# Patient Record
Sex: Female | Born: 1995 | Race: White | Hispanic: No | Marital: Married | Smoking: Never smoker
Health system: Southern US, Community
[De-identification: ages and names within clinical notes are randomized; demographics above are authoritative.]

## PROBLEM LIST (undated history)

## (undated) DIAGNOSIS — J45909 Unspecified asthma, uncomplicated: Secondary | ICD-10-CM

## (undated) DIAGNOSIS — F909 Attention-deficit hyperactivity disorder, unspecified type: Secondary | ICD-10-CM

## (undated) DIAGNOSIS — F419 Anxiety disorder, unspecified: Secondary | ICD-10-CM

## (undated) HISTORY — PX: TYMPANOSTOMY: SHX2586

## (undated) HISTORY — DX: Anxiety disorder, unspecified: F41.9

## (undated) HISTORY — DX: Unspecified asthma, uncomplicated: J45.909

## (undated) HISTORY — DX: Attention-deficit hyperactivity disorder, unspecified type: F90.9

---
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
## 2013-09-05 ENCOUNTER — Encounter: Payer: Self-pay | Admitting: Medical

## 2013-09-05 ENCOUNTER — Ambulatory Visit (INDEPENDENT_AMBULATORY_CARE_PROVIDER_SITE_OTHER): Payer: Managed Care, Other (non HMO) | Admitting: Medical

## 2013-09-05 VITALS — BP 104/60 | HR 89 | Temp 98.2°F | Resp 18 | Ht 70.0 in | Wt 158.0 lb

## 2013-09-05 DIAGNOSIS — F411 Generalized anxiety disorder: Secondary | ICD-10-CM

## 2013-09-05 DIAGNOSIS — J45909 Unspecified asthma, uncomplicated: Secondary | ICD-10-CM

## 2013-09-05 DIAGNOSIS — G43909 Migraine, unspecified, not intractable, without status migrainosus: Secondary | ICD-10-CM

## 2013-09-05 DIAGNOSIS — F909 Attention-deficit hyperactivity disorder, unspecified type: Secondary | ICD-10-CM

## 2013-09-05 MED ORDER — METHYLPHENIDATE HCL 10 MG PO TABS: 10.0000 mg | ORAL_TABLET | Freq: Two times a day (BID) | ORAL | Status: DC

## 2013-09-05 NOTE — Progress Notes (Signed)
Subjective:    Sarah Davidson is a 18 y.o. female here as a new patient.  Here to discuss possible diagnosis of ADHD.  She went to a research study for ADHD.  Had several tests and screening.  At the completion of the phase of study she was in, she wanted to explore treatment options.  Was given option of next phase of study which is non medication counseling, but the time commitment doesn't agree with her schedule.   She is a sophomore at [employer], wants to pursue kinesiology and sports and medicine.  Her academic advisor recommended she join the study at the school.  This was suggested after her freshmen year didn't go so well.  She reports problems with concentration and focus in class, sometimes waits to the last minute to get things done.  Can't sit for very long.  Slight movements in the class causes her to get distracted.  Has tendency to interrupt other.  She is on the soccer team, and given her high school performance, freshmen year grades and being in athletics, has to meet with advisor weekly.    Elementary school - doesn't recall that history as much other than getting in trouble for excessive talking and problems paying attention.  Middle school and high school were similar.  She reports not doing well in high school.  Had issues getting homework turned in.  Mom would have to sit with her and force her to turn things in.  Grades in high school were mostly Cs, occasional Bs.  More difficult classes were  Cs and sometimes Ds.  Now that she has recognized a problem, she has done better and getting work completed and bringing up grades.  Never had evaluation in primary school.    Has one sibling that is not doing well in school.  He yells at her parents a lot, gets into trouble.  Lives in house across from campus's with 3 soccer team mates.  Doesn't drink hardly at all.  Denies drug use, nonsmoker.  Eats healthy.   Doesn't drink soda or a lot of caffeine due to migraines.  No prior legal trouble or  arrests.  Has a boyfriend.    Denies depressed, being down in mood.  Denies mood swing.  +some irritable.  Does have excessive worry, mostly related to school work.     ROS as in subjective  Objective:    BP 104/60  Pulse 89  Temp(Src) 98.2 F (36.8 C)  Resp 18  Ht 5' 10"  (1.778 m)  Wt 158 lb (71.668 kg)  BMI 22.67 kg/m2  SpO2 98%  Objective: General: Well-developed, well-nourished, no acute distress, lean white female Neck: Supple, nontender, no mass, no lymphadenopathy, no thyromegaly Lungs clear Heart RRR, normal S1, normal S2, no murmurs Pulses normal Extremities no edema Psych: Pleasant, answers questions appropriate, seems to get distracted at times    Assessment:   Encounter Diagnoses  Name Primary?  . ADHD (attention deficit hyperactivity disorder) Yes  . Anxiety state, unspecified   . Migraine   . Unspecified asthma(493.90)      Plan:  ADHD, anxiety - discussed her concerns reviewed her recent screening test at [facility] through the ADHD clinic/research trial, showing criteria for ADHD combined type, as well as criteria for generalized anxiety disorder. Her adult ADHD-RS-IV with adult prompts score today - inattentive 24 score, hyperactive/impulse score of 27.   The following criteria for ADHD have been met: inattention, hyperactivity, impulsivity, academic underachievement, behavior problems.  She  is already getting help with counselor at school regarding study skills . I advise she establish with a counselor either at the [facility] ADD clinic or independent counselor to help with managing and dealing with ADHD.  Gave considerable counseling today including study recommendations, sleep, time management, close followup with her advisor. Discussed risk and benefits of medications, and we will use a trial of methylphenidate. Advise she take a half tablet at 9 in the morning, and a half tablet at 4-5pm in the afternoon.  If this works well, we will continue this dose otherwise  she can adjust dose as we discussed today as well as the time a day she takes medication.  Answered her questions. Forms completed for school.  Migraine - having fairly frequent headaches.  She'll return to discuss this when we have more time to devote to this  Asthma - mainly exercise-induced, has up-to-date inhaler, will recheck and discuss further next visit  Duration of today's visit was 50 minutes, with greater than 50% being counsel  F/u 56mosooner, prn

## 2013-09-05 NOTE — Patient Instructions (Addendum)
RESOURCES in [place], [place]  Miguel Aschoff Ph.D., clinical psychologist [phone] office [address] [place], [place] [address] Cognitive Behavior Therapy, Depression, Bipolar, Anxiety, Grief and Loss   AD/HD CLINIC at [facility] at [facility] [address] [address] [place], [place] [address] VOICE :[phone]   [email or web address] [phone] office [address], [place], [place] [address]  Veneda Melter, Ph.D., [facility] Family, Couples, Anxiety, Depression, ADHD, Abuse, Anger Management Sherie Don, M.Ed., LPC Couples, Sexual orientation, Domestic violence, Child Abuse, Major Life Change,  Depression Leandra Kern, [place] Marriage counseling, Women's Issues, Depression, Intimacy, Career Issues Madelaine Etienne, Ph.D.,  LPC PTSD, Addictions, Grief, Anxiety, Sexual Orientation Reather Laurence, [facility] Teen and child depression, anxiety, parenting challenges, Adult depression, self injury, relationship issues. Maple Hudson, LPC Addiction, PTSD, Eating Disorders, Depression, Sexual Orientation Daun Peacock, LPC Eating disorder, Anxiety and Depression, Grief, Divorce, Couples and Family Counseling, Parenting   Attention Deficit Hyperactivity Disorder Attention deficit hyperactivity disorder (ADHD) is a problem with behavior issues based on the way the brain functions (neurobehavioral disorder). It is a common reason for behavior and academic problems in school. SYMPTOMS  There are 3 types of ADHD. The 3 types and some of the symptoms include:  Inattentive  Gets bored or distracted easily.  Loses or forgets things. Forgets to hand in homework.  Has trouble organizing or completing tasks.  Difficulty staying on task.  An inability to organize daily tasks and school work.  Leaving projects, chores, or homework unfinished.  Trouble paying attention or responding to  details. Careless mistakes.  Difficulty following directions. Often seems like is not listening.  Dislikes activities that require sustained attention (like chores or homework).  Hyperactive-impulsive  Feels like it is impossible to sit still or stay in a seat. Fidgeting with hands and feet.  Trouble waiting turn.  Talking too much or out of turn. Interruptive.  Speaks or acts impulsively.  Aggressive, disruptive behavior.  Constantly busy or on the go, noisy.  Often leaves seat when they are expected to remain seated.  Often runs or climbs where it is not appropriate, or feels very restless.  Combined  Has symptoms of both of the above. Often children with ADHD feel discouraged about themselves and with school. They often perform well below their abilities in school. As children get older, the excess motor activities can calm down, but the problems with paying attention and staying organized persist. Most children do not outgrow ADHD but with good treatment can learn to cope with the symptoms. DIAGNOSIS  When ADHD is suspected, the diagnosis should be made by professionals trained in ADHD. This professional will collect information about the individual suspected of having ADHD. Information must be collected from various settings where the person lives, works, or attends school.  Diagnosis will include:  Confirming symptoms began in childhood.  Ruling out other reasons for the child's behavior.  The health care providers will check with the child's school and check their medical records.  They will talk to teachers and parents.  Behavior rating scales for the child will be filled out by those dealing with the child on a daily basis. A diagnosis is made only after all information has been considered. TREATMENT  Treatment usually includes behavioral treatment, tutoring or extra support in school, and stimulant medicines. Because of the way a person's brain works with ADHD,  these medicines decrease impulsivity and hyperactivity and increase attention. This is different than  how they would work in a person who does not have ADHD. Other medicines used include antidepressants and certain blood pressure medicines. Most experts agree that treatment for ADHD should address all aspects of the person's functioning. Along with medicines, treatment should include structured classroom management at school. Parents should reward good behavior, provide constant discipline, and limit-setting. Tutoring should be available for the child as needed. ADHD is a life-long condition. If untreated, the disorder can have long-term serious effects into adolescence and adulthood. HOME CARE INSTRUCTIONS   Often with ADHD there is a lot of frustration among family members dealing with the condition. Blame and anger are also feelings that are common. In many cases, because the problem affects the family as a whole, the entire family may need help. A therapist can help the family find better ways to handle the disruptive behaviors of the person with ADHD and promote change. If the person with ADHD is young, most of the therapist's work is with the parents. Parents will learn techniques for coping with and improving their child's behavior. Sometimes only the child with the ADHD needs counseling. Your health care providers can help you make these decisions.  Children with ADHD may need help learning how to organize. Some helpful tips include:  Keep routines the same every day from wake-up time to bedtime. Schedule all activities, including homework and playtime. Keep the schedule in a place where the person with ADHD will often see it. Mark schedule changes as far in advance as possible.  Schedule outdoor and indoor recreation.  Have a place for everything and keep everything in its place. This includes clothing, backpacks, and school supplies.  Encourage writing down assignments and bringing home  needed books. Work with your child's teachers for assistance in organizing school work.  Offer your child a well-balanced diet. Breakfast that includes a balance of whole grains, protein and, fruits or vegetables is especially important for school performance. Children should avoid drinks with caffeine including:  Soft drinks.  Coffee.  Tea.  However, some older children (adolescents) may find these drinks helpful in improving their attention. Because it can also be common for adolescents with ADHD to become addicted to caffeine, talk with your health care provider about what is a safe amount of caffeine intake for your child.  Children with ADHD need consistent rules that they can understand and follow. If rules are followed, give small rewards. Children with ADHD often receive, and expect, criticism. Look for good behavior and praise it. Set realistic goals. Give clear instructions. Look for activities that can foster success and self-esteem. Make time for pleasant activities with your child. Give lots of affection.  Parents are their children's greatest advocates. Learn as much as possible about ADHD. This helps you become a stronger and better advocate for your child. It also helps you educate your child's teachers and instructors if they feel inadequate in these areas. Parent support groups are often helpful. A national group with local chapters is called Children and Adults with Attention Deficit Hyperactivity Disorder (CHADD). SEEK MEDICAL CARE IF:  Your child has repeated muscle twitches, cough or speech outbursts.  Your child has sleep problems.  Your child has a marked loss of appetite.  Your child develops depression.  Your child has new or worsening behavioral problems.  Your child develops dizziness.  Your child has a racing heart.  Your child has stomach pains.  Your child develops headaches. SEEK IMMEDIATE MEDICAL CARE IF:  Your child has been  diagnosed with  depression or anxiety and the symptoms seem to be getting worse.  Your child has been depressed and suddenly appears to have increased energy or motivation.  You are worried that your child is having a bad reaction to a medication he or she is taking for ADHD. Document Released: 07/23/2002 Document Revised: 05/23/2013 Document Reviewed: 04/09/2013 [facility] Patient Information 2014 [place], [place].   Generalized Anxiety Disorder Generalized anxiety disorder (GAD) is a mental disorder. It interferes with life functions, including relationships, work, and school. GAD is different from normal anxiety, which everyone experiences at some point in their lives in response to specific life events and activities. Normal anxiety actually helps [place] prepare for and get through these life events and activities. Normal anxiety goes away after the event or activity is over.  GAD causes anxiety that is not necessarily related to specific events or activities. It also causes excess anxiety in proportion to specific events or activities. The anxiety associated with GAD is also difficult to control. GAD can vary from mild to severe. People with severe GAD can have intense waves of anxiety with physical symptoms (panic attacks).  SYMPTOMS The anxiety and worry associated with GAD are difficult to control. This anxiety and worry are related to many life events and activities and also occur more days than not for 6 months or longer. People with GAD also have three or more of the following symptoms (one or more in children):  Restlessness.   Fatigue.  Difficulty concentrating.   Irritability.  Muscle tension.  Difficulty sleeping or unsatisfying sleep. DIAGNOSIS GAD is diagnosed through an assessment by your caregiver. Your caregiver will ask you questions aboutyour mood,physical symptoms, and events in your life. Your caregiver may ask you about your medical history and use of alcohol or drugs, including  prescription medications. Your caregiver may also do a physical exam and blood tests. Certain medical conditions and the use of certain substances can cause symptoms similar to those associated with GAD. Your caregiver may refer you to a mental health specialist for further evaluation. TREATMENT The following therapies are usually used to treat GAD:   Medication Antidepressant medication usually is prescribed for long-term daily control. Antianxiety medications may be added in severe cases, especially when panic attacks occur.   Talk therapy (psychotherapy) Certain types of talk therapy can be helpful in treating GAD by providing support, education, and guidance. A form of talk therapy called cognitive behavioral therapy can teach you healthy ways to think about and react to daily life events and activities.  Stress managementtechniques These include yoga, meditation, and exercise and can be very helpful when they are practiced regularly. A mental health specialist can help determine which treatment is best for you. Some people see improvement with one therapy. However, other people require a combination of therapies. Document Released: 11/27/2012 Document Reviewed: 11/27/2012 [facility] Patient Information 2014 [place], [place].

## 2013-09-06 ENCOUNTER — Encounter: Payer: Self-pay | Admitting: Medical

## 2013-09-06 ENCOUNTER — Telehealth: Payer: Self-pay | Admitting: Family Medicine

## 2013-09-06 NOTE — Telephone Encounter (Signed)
[facility] and states you did a letter for the pt today and [place] [facility]  But the [employer] request that the letter be very specific re: ADHD meds.  So Sarah Davidson is asking that you send the same letter to her but at the bottom of the letter add a specific sentence that spells out the medication for the ADHD and the dosing and the follow up plan and [facility] [phone], sorry thought I got the fax.#

## 2013-09-07 ENCOUNTER — Encounter: Payer: Self-pay | Admitting: Medical

## 2013-09-10 ENCOUNTER — Telehealth: Payer: Self-pay | Admitting: Medical

## 2013-09-10 NOTE — Telephone Encounter (Signed)
I spoke to patient, and she notes no change in how she feels on the medication.   I advised she give it more time .  After another week, can try 1.5 tablets (15mg ) at 9am.  I will await her call.

## 2013-10-10 ENCOUNTER — Ambulatory Visit (INDEPENDENT_AMBULATORY_CARE_PROVIDER_SITE_OTHER): Payer: Managed Care, Other (non HMO) | Admitting: Medical

## 2013-10-10 ENCOUNTER — Encounter: Payer: Self-pay | Admitting: Medical

## 2013-10-10 VITALS — BP 100/70 | HR 62 | Temp 98.2°F | Resp 16 | Wt 158.0 lb

## 2013-10-10 DIAGNOSIS — F909 Attention-deficit hyperactivity disorder, unspecified type: Secondary | ICD-10-CM

## 2013-10-10 DIAGNOSIS — F411 Generalized anxiety disorder: Secondary | ICD-10-CM

## 2013-10-10 MED ORDER — AMPHETAMINE-DEXTROAMPHETAMINE 10 MG PO TABS: 10.0000 mg | ORAL_TABLET | Freq: Two times a day (BID) | ORAL | Status: DC

## 2013-10-10 NOTE — Progress Notes (Signed)
Subjective:    Sarah Davidson is a 18 y.o. female here for 12morecheck.  Since [facility]  Due to her busy athletic and school schedule, hasn't been able to go back, but plans to see counseling as we discussed last visit.  She started the Methylphenidate at 191mBID, but after 2 weeks of no obvious effect of the medication, she called in here.  We had her do 1.5 tablets qam, then 2 tablets qam after a week.  At this point she can't tell she is even taking any medication.  Feels no change in focus, attention, or other.   No new c/o.   We made diagnosis of ADHD and anxiety last visit.  She went to a research study for ADHD in the last few months.  Had several tests and screening.  At the completion of the phase of study she was in, she wanted to explore treatment options.  Was given option of next phase of study which is non medication counseling, but the time commitment didn't agree with her schedule.   She is a [employer] pursue kinesiology and sports and medicine.  Her academic advisor recommended she join the study at the school.  This was suggested after her freshmen year didn't go so well.  She reports problems with concentration and focus in class, sometimes waits to the last minute to get things done.  Can't sit for very long.  Slight movements in the class causes her to get distracted.  Has tendency to interrupt other.  She is on the soccer team, and given her high school performance, freshmen year grades and being in athletics, has to meet with advisor weekly.    Elementary school - doesn't recall that history as much other than getting in trouble for excessive talking and problems paying attention.  Middle school and high school were similar.  She reports not doing well in high school.  Had issues getting homework turned in.  Mom would have to sit with her and force her to turn things in.  Grades in high school were mostly Cs, occasional Bs.  More  difficult classes were  Cs and sometimes Ds.  Now that she has recognized a problem, she has done better and getting work completed and bringing up grades.  Never had evaluation in primary school.    Has one sibling that is not doing well in school.  He yells at her parents a lot, gets into trouble.  Lives in house across from campus's with 3 soccer team mates.  Doesn't drink hardly at all.  Denies drug use, nonsmoker.  Eats healthy.   Doesn't drink soda or a lot of caffeine due to migraines.  No prior legal trouble or arrests.  Has a boyfriend.    Denies depressed, being down in mood.  Denies mood swing.  +some irritable.  Does have excessive worry, mostly related to school work.     ROS as in subjective  Objective:    BP 100/70  Pulse 62  Temp(Src) 98.2 F (36.8 C) (Oral)  Resp 16  Wt 158 lb (71.668 kg)  Objective: General: Well-developed, well-nourished, no acute distress, lean white female Psych: Pleasant, answers questions appropriately    Assessment:   Encounter Diagnoses  Name Primary?  . Attention deficit disorder with hyperactivity(314.01) Yes  . Anxiety state, unspecified      Plan:  ADHD, anxiety - discussed her concerns.   Last visit we reviewed  her recent screening test at [facility] through the ADHD clinic/research trial, showing criteria for ADHD combined type, as well as criteria for generalized anxiety disorder. Her adult ADHD-RS-IV with adult prompts score today - inattentive 24 score, hyperactive/impulse score of 27.   The following criteria for ADHD have been met: inattention, hyperactivity, impulsivity, academic underachievement, behavior problems.  She is already getting help with counselor at school regarding study skills.  I advise she c/t seeing a counselor either at the [facility] ADD clinic or independent counselor to help with managing and dealing with ADHD.  Gave considerable counseling today including study recommendations, sleep, time management, close followup  with her advisor.   Discussed risk and benefits of medications.  Given no response at all, we will use trial of Adderall 6m BID.     Duration of visit today 25 minutes, with greater than 50% being counseling  F/u 119mosooner prn

## 2013-11-07 ENCOUNTER — Ambulatory Visit (INDEPENDENT_AMBULATORY_CARE_PROVIDER_SITE_OTHER): Payer: Managed Care, Other (non HMO) | Admitting: Medical

## 2013-11-07 ENCOUNTER — Encounter: Payer: Self-pay | Admitting: Medical

## 2013-11-07 VITALS — BP 100/60 | HR 68 | Temp 98.3°F | Resp 16 | Wt 154.0 lb

## 2013-11-07 DIAGNOSIS — F909 Attention-deficit hyperactivity disorder, unspecified type: Secondary | ICD-10-CM

## 2013-11-07 DIAGNOSIS — F411 Generalized anxiety disorder: Secondary | ICD-10-CM

## 2013-11-07 DIAGNOSIS — J45909 Unspecified asthma, uncomplicated: Secondary | ICD-10-CM

## 2013-11-07 MED ORDER — ALBUTEROL SULFATE HFA 108 (90 BASE) MCG/ACT IN AERS: 2.0000 | INHALATION_SPRAY | Freq: Four times a day (QID) | RESPIRATORY_TRACT | Status: DC | PRN

## 2013-11-07 MED ORDER — METHYLPREDNISOLONE (PAK) 4 MG PO TABS: ORAL_TABLET | ORAL | Status: DC

## 2013-11-07 MED ORDER — ALBUTEROL SULFATE (2.5 MG/3ML) 0.083% IN NEBU: 2.5000 mg | INHALATION_SOLUTION | Freq: Once | RESPIRATORY_TRACT | Status: DC

## 2013-11-07 MED ORDER — AMPHETAMINE-DEXTROAMPHETAMINE 15 MG PO TABS: 15.0000 mg | ORAL_TABLET | Freq: Two times a day (BID) | ORAL | Status: DC

## 2013-11-07 MED ORDER — MOMETASONE FUROATE 220 MCG/INH IN AEPB: 1.0000 | INHALATION_SPRAY | Freq: Two times a day (BID) | RESPIRATORY_TRACT | Status: DC

## 2013-11-07 NOTE — Patient Instructions (Signed)
  Thank you for giving me the opportunity to serve you today.    Your diagnosis today includes: Encounter Diagnoses  Name Primary?  . ADHD (attention deficit hyperactivity disorder) Yes  . Anxiety state, unspecified   . Asthmatic bronchitis      Specific recommendations today include:  Check with your pharmacy for a replacement inhaler as it sounds like your's is defective  Use inhaler 2 puffs every 4- 6 hours or before exercise for the next week  If needed we can send a round of prednisone steroid to pharmacy for asthma flare, but the first step is to use the inhaler first to see if this helps.  You may use Mucinex DM if desired for cough/congestion  Continue your Claritin  Increase to Adderall 15mg  twice daily  Call back in 3-4 weeks to update me on the medication  Call back today if needed for inhaler/steroid   Follow up: with call back

## 2013-11-07 NOTE — Progress Notes (Signed)
Subjective:  Sarah Davidson is a 18 y.o. female who presents for recheck on ADHD.  At last visit we changed to Adderall from methylphenidate.  Currently taking Adderall 10 mg twice daily.  Although she notices improvement on this medication is still quite easy for her to get distracted and lose focus. It worked some but not very strong, feels like it could be stronger.  Certainly works better than methylphenidate.  She does notice a mild decrease in appetite but no sleep issues no other new changes or other side effects.  No headaches like she was getting with methylphenidate.  She does note going to student health a few days ago for cough, was given Z-Pak, using her inhaler but thinks is malfunctioning.  She actually feels worse today with cough, chest tightness, congestion.  No sick contacts.   She does not smoke.  No other aggravating or relieving factors.  No other c/o.  The following portions of the patient's history were reviewed and updated as appropriate: allergies, current medications, past family history, past medical history, past social history, past surgical history and problem list.  ROS as in subjective    Objective: BP 100/60  Pulse 68  Temp(Src) 98.3 F (36.8 C) (Oral)  Resp 16  Wt 154 lb (69.854 kg)   General appearance: Alert, WD/WN, no distress                             Skin: warm, no rash, no diaphoresis                           Head: no sinus tenderness                            Eyes: conjunctiva normal, corneas clear, PERRLA                            Ears: pearly TMs, external ear canals normal                          Nose: septum midline, turbinates swollen, with erythema and clear discharge             Mouth/throat: MMM, tongue normal, mild pharyngeal erythema                           Neck: supple, no adenopathy, no thyromegaly, nontender                          Heart: RRR, normal S1, S2, no murmurs                         Lungs: +bronchial breath  sounds, decreased breath sounds, +scattered rhonchi, +scattered wheezes, no rales                Extremities: no edema, nontender     Assessment: Encounter Diagnoses  Name Primary?  . ADHD (attention deficit hyperactivity disorder) Yes  . Anxiety state, unspecified   . Asthmatic bronchitis     Plan:  ADHD, anxiety - increase to Adderall 20mg  BID.  Discussed medication, risk/benefits.  Call back in 2 wk to update me on symptoms  Asthmatic bronchitis - mild improvement with  2 rounds of albuterol nebulized in office.  Gave demo of asmanex, begin Asmanex short term, begin Medrol dose pak, rest, hydrate well, and advised she go to pharmacy today to get replacement inhaler as her's is apparently malfunctioning. Call/return in 2-3 days if symptoms are worse or not improving.

## 2013-11-07 NOTE — Addendum Note (Signed)
Addended by: Janeice RobinsonSCALES, Dhiren Azimi L on: 11/07/2013 04:31 PM   Modules accepted: Orders

## 2013-12-10 ENCOUNTER — Ambulatory Visit: Payer: Managed Care, Other (non HMO) | Admitting: Medical

## 2013-12-11 ENCOUNTER — Ambulatory Visit: Payer: Managed Care, Other (non HMO) | Admitting: Medical

## 2013-12-12 ENCOUNTER — Ambulatory Visit: Payer: Managed Care, Other (non HMO) | Admitting: Medical

## 2013-12-17 ENCOUNTER — Ambulatory Visit: Payer: Self-pay | Admitting: Medical

## 2013-12-19 ENCOUNTER — Ambulatory Visit: Payer: Managed Care, Other (non HMO) | Admitting: Medical

## 2013-12-19 ENCOUNTER — Encounter: Payer: Self-pay | Admitting: Medical

## 2013-12-24 ENCOUNTER — Encounter: Payer: Self-pay | Admitting: Medical

## 2013-12-24 ENCOUNTER — Ambulatory Visit (INDEPENDENT_AMBULATORY_CARE_PROVIDER_SITE_OTHER): Payer: Managed Care, Other (non HMO) | Admitting: Medical

## 2013-12-24 VITALS — BP 110/68 | HR 87 | Temp 98.1°F | Resp 16 | Wt 154.0 lb

## 2013-12-24 DIAGNOSIS — F909 Attention-deficit hyperactivity disorder, unspecified type: Secondary | ICD-10-CM

## 2013-12-24 DIAGNOSIS — F411 Generalized anxiety disorder: Secondary | ICD-10-CM

## 2013-12-24 MED ORDER — AMPHETAMINE-DEXTROAMPHETAMINE 15 MG PO TABS: 15.0000 mg | ORAL_TABLET | Freq: Two times a day (BID) | ORAL | Status: DC

## 2013-12-24 NOTE — Progress Notes (Signed)
Subjective:  Sarah Davidson is a 18 y.o. female who presents for recheck on ADHD.  Currently on Adderall 15mg  BID.  Takes first dose about 8:15pm.  Takes second dose 3pm.  Notices difference on this medication and dose.  However, it has impacted her appetite some, and still zones out at times.  [place] [employer] or studying does fine, but sometimes distractions in class can still get her off task.  Over feels like this is doing better for her.   Starts summer school Thursday for first session.  She is seeing therapist regularly, sometimes 3-4 days per week. Is in summer soccer, but not the same intense regimen as in normal semester.  Still worries at times about her school work, test and upcoming assignments.  Bites nails a lot during exam times.   The following portions of the patient's history were reviewed and updated as appropriate: allergies, current medications, past family history, past medical history, past social history, past surgical history and problem list.  ROS as in subjective    Objective: BP 110/68  Pulse 87  Temp(Src) 98.1 F (36.7 C) (Oral)  Resp 16  Wt 154 lb (69.854 kg)    General appearance: Alert, WD/WN, no distress                             Heart: RRR, normal S1, S2, no murmurs                         Lungs: clear, no rhonchi, no wheezes, no rales                Extremities: no edema, nontender   Psych: pleasant, answers questions appropriately  Assessment: Encounter Diagnoses  Name Primary?  . ADHD (attention deficit hyperactivity disorder) Yes  . Generalized anxiety disorder     Plan:  discussed her medications, side effects, mood, her counseling sessions .   At this point we will c/t Adderall 15mg  BID, c/t counseling.  Discussed diet, appetite, sleep, weight, classroom strategies.   Consider SSRI going forward if needed.  F/u 1-2 mo

## 2013-12-27 ENCOUNTER — Encounter: Payer: Self-pay | Admitting: Family Medicine

## 2014-05-01 ENCOUNTER — Encounter: Payer: Self-pay | Admitting: Medical

## 2014-05-01 ENCOUNTER — Ambulatory Visit (INDEPENDENT_AMBULATORY_CARE_PROVIDER_SITE_OTHER): Payer: Managed Care, Other (non HMO) | Admitting: Medical

## 2014-05-01 VITALS — BP 110/70 | HR 78 | Temp 98.0°F | Resp 16 | Wt 154.0 lb

## 2014-05-01 DIAGNOSIS — F411 Generalized anxiety disorder: Secondary | ICD-10-CM

## 2014-05-01 DIAGNOSIS — F909 Attention-deficit hyperactivity disorder, unspecified type: Secondary | ICD-10-CM

## 2014-05-01 DIAGNOSIS — F902 Attention-deficit hyperactivity disorder, combined type: Secondary | ICD-10-CM

## 2014-05-01 DIAGNOSIS — I10 Essential (primary) hypertension: Secondary | ICD-10-CM

## 2014-05-01 DIAGNOSIS — T50905A Adverse effect of unspecified drugs, medicaments and biological substances, initial encounter: Secondary | ICD-10-CM

## 2014-05-01 DIAGNOSIS — J45909 Unspecified asthma, uncomplicated: Secondary | ICD-10-CM

## 2014-05-01 DIAGNOSIS — J4530 Mild persistent asthma, uncomplicated: Secondary | ICD-10-CM

## 2014-05-01 MED ORDER — LISDEXAMFETAMINE DIMESYLATE 40 MG PO CAPS: 40.0000 mg | ORAL_CAPSULE | ORAL | Status: DC

## 2014-05-01 MED ORDER — MOMETASONE FUROATE 220 MCG/INH IN AEPB: 1.0000 | INHALATION_SPRAY | Freq: Two times a day (BID) | RESPIRATORY_TRACT | Status: DC

## 2014-05-01 MED ORDER — ALBUTEROL SULFATE HFA 108 (90 BASE) MCG/ACT IN AERS: 2.0000 | INHALATION_SPRAY | Freq: Four times a day (QID) | RESPIRATORY_TRACT | Status: DC | PRN

## 2014-05-01 NOTE — Progress Notes (Signed)
Subjective:    Sarah Davidson is a 18 y.o. female here for recheck on medications.  ADHD - did well in summer session.  She is a Holiday representative at [employer], wants to pursue kinesiology and sports and medicine.  On Adderrall  BID.   She notes when she is taking it consistently does pretty well but, anytime she is often such as being off in the summer for period time, gets emotional when coming off or starting back.  Ever since she has been on the Adderall her emotions are flat, doesn't feel happy or sad, doesn't like the way it makes her feel but does help with focus. Also effects her appetite, has to force herself to eat.  Lately she has been down in her mood, given her soccer injury this summer she is still dealing with not feeling a part of the team, still try to get back to normal with her injury.  Is exercising, eating healthy, eats 3 times a day sometimes with snacks.  We'll be starting to see a counselor soon to do with some of her mood given her injury.  Seeing counselor at school  Asthma - doing well with Asmanex preventative inhaler twice daily, has not had to use her albuterol since last visit, this is really worked well. No allergy concerns at this time   ROS as in subjective  Objective:    BP 110/70  Pulse 78  Temp(Src) 98 F (36.7 C) (Oral)  Resp 16  Wt 154 lb (69.854 kg)   Objective: General: Well-developed, well-nourished, no acute distress, lean white female Neck: Supple, nontender, no mass, no lymphadenopathy, no thyromegaly Lungs clear Heart RRR, normal S1, normal S2, no murmurs Pulses normal Extremities no edema Psych: Pleasant, answers questions appropriately    Assessment:   Encounter Diagnoses  Name Primary?  . Attention deficit hyperactivity disorder (ADHD), combined type Yes  . Asthma, mild persistent, uncomplicated   . Anxiety state, unspecified   . Adverse effects of medication, initial encounter      Plan:   ADHD, anxiety - pursue counseling as planned.   Stop Adderall given the flat affect and decreased mood.  We discussed options, she originally did not do well with methylphenidate had no response to this.  Begin trial of Vyvanse 40 mg daily.  Discussed risk and benefits of medication, discussed organization, study skills, call back in one month  Asthma - doing well on Asmanex twice daily, continue rinse mouth out water, albuterol as needed but hasn't had to use this, doing well on current regimen  She will get free flu vaccine at school through soccer team   Call back in one month

## 2014-06-24 ENCOUNTER — Encounter: Payer: Self-pay | Admitting: Medical

## 2014-06-24 ENCOUNTER — Ambulatory Visit (INDEPENDENT_AMBULATORY_CARE_PROVIDER_SITE_OTHER): Payer: Managed Care, Other (non HMO) | Admitting: Medical

## 2014-06-24 VITALS — BP 100/70 | HR 72 | Temp 98.1°F | Resp 16 | Wt 159.0 lb

## 2014-06-24 DIAGNOSIS — F902 Attention-deficit hyperactivity disorder, combined type: Secondary | ICD-10-CM

## 2014-06-24 MED ORDER — LISDEXAMFETAMINE DIMESYLATE 50 MG PO CAPS: 50.0000 mg | ORAL_CAPSULE | Freq: Every day | ORAL | Status: DC

## 2014-06-24 NOTE — Progress Notes (Signed)
Subjective:    Sarah Davidson is a 18 y.o. female here for recheck on medications.  ADHD - since last visit we changes to Vyvanse 40mg  from Adderall. So far this is working,just seems to wear off by lunch time.  Eating fine, sleep is fine, not feelng emotional on this.   Happy most of the time, sees Advisor weekly.  No other c/o.   ROS as in subjective  Objective:    BP 100/70 mmHg  Pulse 72  Temp(Src) 98.1 F (36.7 C) (Oral)  Resp 16  Wt 159 lb (72.122 kg)   Objective: General: Well-developed, well-nourished, no acute distress, lean white female Psych: Pleasant, answers questions appropriately    Assessment:   Encounter Diagnosis  Name Primary?  . Attention deficit hyperactivity disorder (ADHD), combined type Yes     Plan:   ADHD - So far seeing improvement on Vyvanse just needs to work longer throughout the day, increase to Vyvanse 50 mg daily, call back within 30 days

## 2014-07-18 ENCOUNTER — Telehealth: Payer: Self-pay | Admitting: Internal Medicine

## 2014-07-18 NOTE — Telephone Encounter (Signed)
Call and see if any improvement on the 50mg  at all?  She had seen improvement on the 40mg , but we increased last visit.  How long can she tell the medication is in the system?  We can go to 60mg  if she feels we need to make this change.

## 2014-07-18 NOTE — Telephone Encounter (Signed)
Pt called stating that the vyvanse 50mg  is not doing much of a difference than on the 40mg . She would like to talk to you about the 60mg . She is not having any side effects other than it just not helping. Please advise @ [phone]

## 2014-07-19 ENCOUNTER — Other Ambulatory Visit: Payer: Self-pay | Admitting: Medical

## 2014-07-19 MED ORDER — LISDEXAMFETAMINE DIMESYLATE 60 MG PO CAPS: 60.0000 mg | ORAL_CAPSULE | ORAL | Status: DC

## 2014-07-19 NOTE — Telephone Encounter (Signed)
Patient states that she started the 50 mg dose the after her last appointment here. She states that she doesn't see a difference from the 40 mg dose to the 50 mg dose. Patient states that she would agree to going up to the 60 mg dose. She would like to pick up the Rx today.

## 2014-07-19 NOTE — Telephone Encounter (Signed)
Called pt and advised Rx ready for pick up and to call back in 1 month

## 2014-07-19 NOTE — Telephone Encounter (Signed)
rx ready for pickup.  Call back with update in 74mo

## 2014-08-26 ENCOUNTER — Encounter: Payer: Self-pay | Admitting: Medical

## 2014-08-26 ENCOUNTER — Ambulatory Visit (INDEPENDENT_AMBULATORY_CARE_PROVIDER_SITE_OTHER): Payer: Managed Care, Other (non HMO) | Admitting: Medical

## 2014-08-26 VITALS — BP 100/70 | HR 68 | Temp 97.9°F | Resp 16 | Wt 154.0 lb

## 2014-08-26 DIAGNOSIS — G4489 Other headache syndrome: Secondary | ICD-10-CM

## 2014-08-26 DIAGNOSIS — I889 Nonspecific lymphadenitis, unspecified: Secondary | ICD-10-CM

## 2014-08-26 DIAGNOSIS — R509 Fever, unspecified: Secondary | ICD-10-CM

## 2014-08-26 DIAGNOSIS — R05 Cough: Secondary | ICD-10-CM

## 2014-08-26 DIAGNOSIS — R059 Cough, unspecified: Secondary | ICD-10-CM

## 2014-08-26 DIAGNOSIS — F411 Generalized anxiety disorder: Secondary | ICD-10-CM

## 2014-08-26 LAB — CBC WITH DIFFERENTIAL/PLATELET
BASOS PCT: 1 % (ref 0–1)
Basophils Absolute: 0.1 10*3/uL (ref 0.0–0.1)
EOS ABS: 0.1 10*3/uL (ref 0.0–0.7)
EOS PCT: 1 % (ref 0–5)
HEMATOCRIT: 39.4 % (ref 36.0–46.0)
Hemoglobin: 13.3 g/dL (ref 12.0–15.0)
Lymphocytes Relative: 38 % (ref 12–46)
Lymphs Abs: 2.2 10*3/uL (ref 0.7–4.0)
MCH: 30.1 pg (ref 26.0–34.0)
MCHC: 33.8 g/dL (ref 30.0–36.0)
MCV: 89.1 fL (ref 78.0–100.0)
MONO ABS: 0.6 10*3/uL (ref 0.1–1.0)
MPV: 10.8 fL (ref 8.6–12.4)
Monocytes Relative: 10 % (ref 3–12)
NEUTROS ABS: 3 10*3/uL (ref 1.7–7.7)
NEUTROS PCT: 50 % (ref 43–77)
Platelets: 303 10*3/uL (ref 150–400)
RBC: 4.42 MIL/uL (ref 3.87–5.11)
RDW: 14.1 % (ref 11.5–15.5)
WBC: 5.9 10*3/uL (ref 4.0–10.5)

## 2014-08-26 NOTE — Addendum Note (Signed)
Addended by: Jac CanavanYSINGER, Prima Rayner S on: 08/26/2014 10:57 AM   Modules accepted: Level of Service

## 2014-08-26 NOTE — Progress Notes (Signed)
Subjective: Here for knot under left armpit.   Had knot of left axilla 2 wk ago, went to doctor back home.  Was put on antibiotic for "infection" amoxicillin x 10 days.  Completed the antibiotic, pain improved.   When she finished the antibiotic started to feel the pain come back.   Doesn't necessarily feel a knot currently, but still quite tender.  No prior similar episode.  Denies lumps in breasts, right axilla or otherwise.    originally the left axilla was red.   Shaves regularly in axilla.  No prior abscess, no hx/o MRSA.  Has room mates here at college, but no body she has been around has skin infection.   Has cat in the room at the dorm, but denies cat scratch or bite.   Has had some nausea, fatigue initially but this has resolved.  Had fever 1.5 a week and half ago, but was feeling sick with cold symptom then too.  No current sore throat.  Has been getting headaches more frequency, not sleeping all that well.  Not excited about going back to school this semester.   Seeing eye doctor this week to make sure no vision changes to account for headaches.   No other aggravating or relieving factors. No other complaint.  ROS as in subjective  Objective: Gen: wd, wn, nad Skin: unremarkable, no erythema, no nodules, no specific findings in left axilla or in general HENT: Pharynx with erythema, tonsil seems somewhat swollen otherwise head nontender, TMs pearly except for scar tissue, nares patent Neck: Supple, nontender, shotty anterior nodes otherwise no lymphadenopathy, no thyromegaly Breasts: no specific lump or mass, no other abnromal findings Axilla: left anterior axilla under pectoralis muscle with tenderness and small node that is reduced in size compared to prior per patient, otherwise no skin abnormality, no other lymph nodes palpable, no other tenderness, no fluctuance, induration or warmth Lungs clear Heart RRR, S1 and S2 normal, no murmurs Psych: pleasant, good eye contact, answers questions  appropriately   Assessment: Encounter Diagnoses  Name Primary?  Marland Kitchen. Axillary lymphadenitis Yes  . Fever and chills   . Cough   . Anxiety state   . Other headache syndrome     Plan: Axillary lymphadenitis, fever, chills, cough - discussed differential for lymphadenopathy.  CBC and mono/EBV labs today.   She has had recent URI symptoms, but no other obvious causes.  No obvious skin infection in axilla today.  Anxiety, headache - [facility]  Advised she return to discuss headaches, sleep, and anxiety further soon.  We did discuss briefly preventative measures for headaches such as limiting caffeine, sleep hgyeeing, stress reduction.   F/u soon for further management.

## 2014-08-27 LAB — EPSTEIN-BARR VIRUS VCA ANTIBODY PANEL
EBV EA IgG: <5 U/mL (ref <9.0)
EBV NA IgG: 21.2 U/mL — ABNORMAL HIGH (ref <18.0)
EBV VCA IGG: 256 U/mL — AB (ref <18.0)

## 2014-10-16 ENCOUNTER — Other Ambulatory Visit: Payer: Self-pay | Admitting: Medical

## 2014-10-16 ENCOUNTER — Telehealth: Payer: Self-pay | Admitting: Medical

## 2014-10-16 MED ORDER — LISDEXAMFETAMINE DIMESYLATE 60 MG PO CAPS: 60.0000 mg | ORAL_CAPSULE | ORAL | Status: DC

## 2014-10-16 NOTE — Telephone Encounter (Signed)
Pt called for refills of vyvance 60 mg. Please call [phone] when ready.

## 2014-10-16 NOTE — Telephone Encounter (Signed)
rx ready to pick up

## 2014-10-17 NOTE — Telephone Encounter (Signed)
Pt informed

## 2015-06-03 ENCOUNTER — Emergency Department (HOSPITAL_COMMUNITY)
Admission: EM | Admit: 2015-06-03 | Discharge: 2015-06-03 | Disposition: A | Discharge status: Home / Self Care | Payer: Managed Care, Other (non HMO) | Attending: Emergency Medicine | Admitting: Emergency Medicine

## 2015-06-03 ENCOUNTER — Encounter (HOSPITAL_COMMUNITY): Payer: Self-pay | Admitting: Emergency Medicine

## 2015-06-03 DIAGNOSIS — Z79899 Other long term (current) drug therapy: Secondary | ICD-10-CM | POA: Insufficient documentation

## 2015-06-03 DIAGNOSIS — Y9289 Other specified places as the place of occurrence of the external cause: Secondary | ICD-10-CM | POA: Insufficient documentation

## 2015-06-03 DIAGNOSIS — Y9389 Activity, other specified: Secondary | ICD-10-CM | POA: Diagnosis not present

## 2015-06-03 DIAGNOSIS — Z720 Tobacco use: Secondary | ICD-10-CM | POA: Diagnosis not present

## 2015-06-03 DIAGNOSIS — W25XXXA Contact with sharp glass, initial encounter: Secondary | ICD-10-CM | POA: Diagnosis not present

## 2015-06-03 DIAGNOSIS — J45909 Unspecified asthma, uncomplicated: Secondary | ICD-10-CM | POA: Insufficient documentation

## 2015-06-03 DIAGNOSIS — F419 Anxiety disorder, unspecified: Secondary | ICD-10-CM | POA: Insufficient documentation

## 2015-06-03 DIAGNOSIS — F909 Attention-deficit hyperactivity disorder, unspecified type: Secondary | ICD-10-CM | POA: Diagnosis not present

## 2015-06-03 DIAGNOSIS — S61012A Laceration without foreign body of left thumb without damage to nail, initial encounter: Secondary | ICD-10-CM | POA: Diagnosis not present

## 2015-06-03 DIAGNOSIS — Y998 Other external cause status: Secondary | ICD-10-CM | POA: Insufficient documentation

## 2015-06-03 MED ORDER — LIDOCAINE HCL (PF) 1 % IJ SOLN
5.0000 mL | Freq: Once | INTRAMUSCULAR | Status: AC
  Administered 2015-06-03: 5 mL via INTRADERMAL
  Filled 2015-06-03: qty 5

## 2015-06-03 NOTE — ED Notes (Signed)
Pt. presents with laceration at left thumb with minimal bleeding sustained from a broken glass this morning . Dressing applied at triage .

## 2015-06-03 NOTE — Discharge Instructions (Signed)
Keep wound area clean. Return to the ED in 10 days for suture removal.

## 2015-06-03 NOTE — ED Notes (Signed)
Sutures placed by kaitlyn, pa-c to left index finger. bandaid applied, explained home care and to return in discharge teaching. Ride is present at the bedside.

## 2015-06-03 NOTE — ED Provider Notes (Signed)
CSN: [id number]     Arrival date & time 06/03/15  [id number] History   First MD Initiated Contact with Patient 06/03/15 [phone]     Chief Complaint  Patient presents with  . Laceration     (Consider location/radiation/quality/duration/timing/severity/associated sxs/prior Treatment) Patient is a 19 y.o. female presenting with skin laceration. The history is provided by the patient. No language interpreter was used.  Laceration Location:  Finger Finger laceration location:  L thumb Length (cm):  2.0 Depth:  Through dermis Quality: straight   Bleeding: controlled   Time since incident:  1 hour Laceration mechanism:  Broken glass Pain details:    Quality:  Aching   Severity:  Moderate   Timing:  Constant   Progression:  Unchanged Foreign body present:  No foreign bodies Relieved by:  Nothing Worsened by:  Nothing tried Ineffective treatments:  None tried Tetanus status:  Up to date   Past Medical History  Diagnosis Date  . ADHD (attention deficit hyperactivity disorder) 2014  . Asthma   . Anxiety    History reviewed. No pertinent past surgical history. No family history on file. Social History  Substance Use Topics  . Smoking status: Current Some Day Smoker  . Smokeless tobacco: None  . Alcohol Use: No   OB History    No data available     Review of Systems  Skin: Positive for wound.  All other systems reviewed and are negative.     Allergies  Sulfa antibiotics  Home Medications   Prior to Admission medications   Medication Sig Start Date End Date Taking? Authorizing Provider  albuterol (PROVENTIL HFA;VENTOLIN HFA) 108 (90 BASE) MCG/ACT inhaler Inhale 2 puffs into the lungs every 6 (six) hours as needed for wheezing or shortness of breath. 05/01/14   Kermit Baloavid S Tysinger, PA-C  lisdexamfetamine (VYVANSE) 60 MG capsule Take 1 capsule (60 mg total) by mouth every morning. 10/16/14   Kermit Baloavid S Tysinger, PA-C  mometasone (ASMANEX 60 METERED DOSES) 220 MCG/INH inhaler Inhale 1  puff into the lungs 2 (two) times daily. 05/01/14   Kermit Baloavid S Tysinger, PA-C   BP 122/79 mmHg  Pulse 98  Temp(Src) 98.6 F (37 C) (Oral)  Resp 18  Ht 5' 9.75" (1.772 m)  Wt 166 lb (75.297 kg)  BMI 23.98 kg/m2  SpO2 97%  LMP 05/17/2015 (Approximate) Physical Exam  Constitutional: She is oriented to person, place, and time. She appears well-developed and well-nourished. No distress.  HENT:  Head: Normocephalic and atraumatic.  Eyes: Conjunctivae and EOM are normal.  Neck: Normal range of motion.  Cardiovascular: Normal rate and regular rhythm.  Exam reveals no gallop and no friction rub.   No murmur heard. Pulmonary/Chest: Effort normal and breath sounds normal. She has no wheezes. She has no rales. She exhibits no tenderness.  Abdominal: Soft. She exhibits no distension. There is no tenderness. There is no rebound.  Musculoskeletal: Normal range of motion.  Neurological: She is alert and oriented to person, place, and time. Coordination normal.  Speech is goal-oriented. Moves limbs without ataxia.   Skin: Skin is warm and dry.  2cm laceration of left lateral distal thumb with bleeding controlled.   Psychiatric: She has a normal mood and affect. Her behavior is normal.  Nursing note and vitals reviewed.   ED Course  Procedures (including critical care time)  LACERATION REPAIR Performed by: Emilia BeckKaitlyn Abijah Roussel Authorized by: Emilia BeckKaitlyn Perry Molla Consent: Verbal consent obtained. Risks and benefits: risks, benefits and alternatives were discussed Consent given by: patient  Patient identity confirmed: provided demographic data Prepped and Draped in normal sterile fashion Wound explored  Laceration Location: left thumb  Laceration Length: 2 cm  No Foreign Bodies seen or palpated  Anesthesia: local infiltration  Local anesthetic: lidocaine 1% without epinephrine  Anesthetic total: 1 ml  Irrigation method: syringe Amount of cleaning: standard  Skin closure: 5-0  prolene  Number of sutures: 2  Technique: simple  Patient tolerance: Patient tolerated the procedure well with no immediate complications.   Labs Review Labs Reviewed - No data to display  Imaging Review No results found. I have personally reviewed and evaluated these images and lab results as part of my medical decision-making.   EKG Interpretation None      MDM   Final diagnoses:  Thumb laceration, left, initial encounter   5:39 AM Patient's laceration repaired without difficulties. Patient instructed to return in 10 days for suture removal.     Emilia Beck, PA-C 06/03/15 0541  Azalia Bilis, MD 06/03/15 [phone]

## 2015-07-02 ENCOUNTER — Ambulatory Visit: Payer: Managed Care, Other (non HMO) | Admitting: Medical

## 2015-07-03 ENCOUNTER — Ambulatory Visit (INDEPENDENT_AMBULATORY_CARE_PROVIDER_SITE_OTHER): Payer: Managed Care, Other (non HMO) | Admitting: Medical

## 2015-07-03 ENCOUNTER — Encounter: Payer: Self-pay | Admitting: Medical

## 2015-07-03 VITALS — BP 110/60 | HR 71 | Wt 164.0 lb

## 2015-07-03 DIAGNOSIS — G43711 Chronic migraine without aura, intractable, with status migrainosus: Secondary | ICD-10-CM | POA: Diagnosis not present

## 2015-07-03 DIAGNOSIS — F902 Attention-deficit hyperactivity disorder, combined type: Secondary | ICD-10-CM | POA: Diagnosis not present

## 2015-07-03 DIAGNOSIS — H539 Unspecified visual disturbance: Secondary | ICD-10-CM

## 2015-07-03 MED ORDER — LISDEXAMFETAMINE DIMESYLATE 60 MG PO CAPS: 60.0000 mg | ORAL_CAPSULE | ORAL | Status: DC

## 2015-07-03 MED ORDER — SUMATRIPTAN SUCCINATE 50 MG PO TABS: 50.0000 mg | ORAL_TABLET | ORAL | Status: DC | PRN

## 2015-07-03 MED ORDER — HYDROCODONE-ACETAMINOPHEN 5-325 MG PO TABS: 1.0000 | ORAL_TABLET | Freq: Four times a day (QID) | ORAL | Status: DC | PRN

## 2015-07-03 MED ORDER — ONDANSETRON HCL 4 MG PO TABS: 4.0000 mg | ORAL_TABLET | Freq: Three times a day (TID) | ORAL | Status: DC | PRN

## 2015-07-03 NOTE — Progress Notes (Signed)
Subjective: Chief Complaint  Patient presents with  . Headache    says she has not been on adhd medication so thinks that may have something to do with it. said this headache has not went away at all in last 5 days taken ibuprofen and said it did not help   Here for hx/o headache for 5 days.  Was severe, but now 5/10 level.  Headache has been persistent for 5 days.  Current headache in back of head, but this started in front of face.   Throbbing, but now more dull. Gets nausea with this, vomiting once.   No recent head injury or trauma.   In the past got migraines regularly, was on preventative medications in the prior.  In general gets 2-3 headaches weekly, usually they are brief, but this one lasted 5 days.   Ibuprofen or Excedrin normally works, but not so much this time.  She notes that she hasn't been taking her ADD medication, and thinks this may have played a role.   She hasn't come in for refill lately so ran out.   Headaches normally triggered with small print, cell phones, watching tv, particularly if not wearing glasses.  Has never seen headache specialist.  She has been drink a fair amount of caffeine.   Having to work longer hours of late which may trigger headache. Needs refill on ADD medication which is working ok. The first day of this headache had tunnel vision in 1 eye which she has had in the past with migraines.  No other aggravating or relieving factors. No other complaint.  Past Medical History  Diagnosis Date  . ADHD (attention deficit hyperactivity disorder) 2014  . Asthma   . Anxiety    ROS as in subjective  Objective: BP 110/60 mmHg  Pulse 71  Wt 164 lb (74.39 kg)  SpO2 98%  LMP 06/17/2015  General appearance: alert, no distress, WD/WN HEENT: normocephalic, sclerae anicteric, PERRLA, EOMi, nares patent, no discharge or erythema, pharynx normal Oral cavity: MMM, no lesions Neck: supple, no lymphadenopathy, no thyromegaly, no masses Heart: RRR, normal S1, S2, no  murmurs Lungs: CTA bilaterally, no wheezes, rhonchi, or rales Extremities: no edema, no cyanosis, no clubbing Pulses: 2+ symmetric, upper and lower extremities, normal cap refill Neurological: alert, oriented x 3, CN2-12 intact, strength normal upper extremities and lower extremities, sensation normal throughout, DTRs 2+ throughout, no cerebellar signs, gait normal Psychiatric: normal affect, behavior normal, pleasant   Assessment: Encounter Diagnoses  Name Primary?  . Intractable chronic migraine without aura and with status migrainosus Yes  . Visual disturbance   . Attention deficit hyperactivity disorder (ADHD), combined type      Plan: discussed current intractable headache, hx/o migraines, possible triggers. She has started keeping a headache diary.  She has been on prophylactic medication years past.   For today use the hydrocodone for acute headache, Zofran prn.  Discussed proper use of Imitrex going forward, Zofran prn, avoidance of triggers.  Can resume her ADD medication after this headache resolves.  discussed if she continues to get frequent headache to return and discuss.   Sarah Davidson was seen today for headache.  Diagnoses and all orders for this visit:  Intractable chronic migraine without aura and with status migrainosus  Visual disturbance  Attention deficit hyperactivity disorder (ADHD), combined type  Other orders -     lisdexamfetamine (VYVANSE) 60 MG capsule; Take 1 capsule (60 mg total) by mouth every morning. -     SUMAtriptan (IMITREX) 50  MG tablet; Take 1 tablet (50 mg total) by mouth every 2 (two) hours as needed for migraine. May repeat in 2 hours if headache persists or recurs. -     ondansetron (ZOFRAN) 4 MG tablet; Take 1 tablet (4 mg total) by mouth every 8 (eight) hours as needed for nausea or vomiting. -     HYDROcodone-acetaminophen (NORCO/VICODIN) 5-325 MG tablet; Take 1 tablet by mouth every 6 (six) hours as needed for moderate pain.

## 2015-09-25 ENCOUNTER — Ambulatory Visit (INDEPENDENT_AMBULATORY_CARE_PROVIDER_SITE_OTHER): Payer: Managed Care, Other (non HMO) | Admitting: Medical

## 2015-09-25 ENCOUNTER — Encounter: Payer: Self-pay | Admitting: Medical

## 2015-09-25 VITALS — BP 108/72 | HR 73 | Temp 99.4°F | Wt 153.0 lb

## 2015-09-25 DIAGNOSIS — J029 Acute pharyngitis, unspecified: Secondary | ICD-10-CM | POA: Diagnosis not present

## 2015-09-25 DIAGNOSIS — R509 Fever, unspecified: Secondary | ICD-10-CM | POA: Diagnosis not present

## 2015-09-25 DIAGNOSIS — J039 Acute tonsillitis, unspecified: Secondary | ICD-10-CM | POA: Diagnosis not present

## 2015-09-25 LAB — POCT RAPID STREP A (OFFICE): RAPID STREP A SCREEN: NEGATIVE

## 2015-09-25 LAB — POCT MONO (EPSTEIN BARR VIRUS): MONO, POC: NEGATIVE

## 2015-09-25 MED ORDER — AMOXICILLIN 875 MG PO TABS: 875.0000 mg | ORAL_TABLET | Freq: Two times a day (BID) | ORAL | Status: DC

## 2015-09-25 NOTE — Progress Notes (Signed)
  Subjective: Sarah Davidson is a 20 y.o. female who presents for evaluation of sore throat.  Had some cough and congestion last week, but acute onset of sore throat, swollen nodes, red throat, swollen tonsils, and white stuff on tonsils.  Has felt feverish, some ear pain, some sinus pressure.  Has had both mono and strep contacts in recent weeks.  No other aggravating or relieving factors. No other complaint.  The following portions of the patient's history were reviewed and updated as appropriate: allergies, current medications, past medical history, past social history, past surgical history and problem list.  Past Medical History  Diagnosis Date  . ADHD (attention deficit hyperactivity disorder) 2014  . Asthma   . Anxiety     ROS as in subjective    Objective: BP 108/72 mmHg  Pulse 73  Temp(Src) 99.4 F (37.4 C) (Tympanic)  Wt 153 lb (69.4 kg)  LMP 09/17/2015  General appearance: no distress, WD/WN HEENT: normocephalic, conjunctiva/corneas normal, sclerae anicteric, nares patent, no discharge or erythema, pharynx with erythema,2+ tonsils with erythema, white exudate.  Oral cavity: MMM, no lesions  Neck: supple, shoddy anterior and a few posterior tender nodes, no thyromegaly Lungs: CTA bilaterally, no wheezes, rhonchi, or rales    Laboratory Strep test done. Results:negative. Mono test done.   Results: negative    Assessment: Encounter Diagnosis  Name Primary?  . Sore throat Yes     Plan: Begin Amoxicillin for tonsillitis.  discussed diagnosis, treatment, possible complications.  Discussed period of contagion. Discussed supportive care, rest, hydration, can use OTC analgesics, and if worse or not improving within 4-5 days, then let me know .  Genie was seen today for sore throat.  Diagnoses and all orders for this visit:  Sore throat -     POCT rapid strep A -     Mono (Epstein Barr Virus)  Acute tonsillitis, unspecified etiology  Fever,  unspecified  Other orders -     amoxicillin (AMOXIL) 875 MG tablet; Take 1 tablet (875 mg total) by mouth 2 (two) times daily.

## 2015-12-02 ENCOUNTER — Encounter: Payer: Self-pay | Admitting: Family Medicine

## 2015-12-02 ENCOUNTER — Ambulatory Visit (INDEPENDENT_AMBULATORY_CARE_PROVIDER_SITE_OTHER): Payer: Managed Care, Other (non HMO) | Admitting: Family Medicine

## 2015-12-02 VITALS — BP 110/60 | HR 92 | Temp 97.9°F | Wt 150.0 lb

## 2015-12-02 DIAGNOSIS — J45990 Exercise induced bronchospasm: Secondary | ICD-10-CM

## 2015-12-02 DIAGNOSIS — J4520 Mild intermittent asthma, uncomplicated: Secondary | ICD-10-CM

## 2015-12-02 MED ORDER — AMOXICILLIN 875 MG PO TABS: 875.0000 mg | ORAL_TABLET | Freq: Two times a day (BID) | ORAL | Status: DC

## 2015-12-02 NOTE — Progress Notes (Signed)
Subjective:     Patient ID: Sarah Davidson, female   DOB: 11/22/1995, 20 y.o.   MRN: [id number]  HPI Sarah Davidson presents to clinic today with a history of bronchitis treated with Zpac, albuterol, and flonase in [place] 2.5 wks ago.  She finished the treatment and is feeling around 65% better, but still has nasal congestion, PND, non-productive cough, and SOB and wheezing that she especially notices at night.  She denies fever, facial pain, tooth pain, and sore throat.  She does not smoke, but has exercise-induced asthma, and a history of allergies.     Review of Systems     Objective:   Physical Exam Alert and in no distress.  Tympanic membranes and canals are normal. Pharyngeal area is normal. Neck is supple without adenopathy or thyromegaly.  Cardiac exam shows a regular sinus rhythm without murmurs or gallops.  Harsh breath sounds heard bilaterally with forced expiration.       Assessment/Plan:     Asthmatic bronchitis, mild intermittent, uncomplicated - Plan: amoxicillin (AMOXIL) 875 MG tablet  Exercise-induced asthma  Asthmatic bronchitis She is still having lung issues after Zpac treatment, will switch to a 10 d course of amoxicillin.  We also discussed using her albuterol if that helps with her breathing.    He is to call if not entirely better when she finishes the antibiotic    History and physical exam conducted by Sarah Davidson (Medical Student) in conjunction with Dr. Susann Davidson.

## 2015-12-02 NOTE — Patient Instructions (Signed)
Use your inhaler as needed and if not totally back to normal you finish the antibiotic ,call.

## 2016-01-23 ENCOUNTER — Ambulatory Visit (INDEPENDENT_AMBULATORY_CARE_PROVIDER_SITE_OTHER): Payer: Managed Care, Other (non HMO) | Admitting: Family Medicine

## 2016-01-23 ENCOUNTER — Encounter: Payer: Self-pay | Admitting: Family Medicine

## 2016-01-23 VITALS — BP 110/70 | HR 90 | Temp 98.2°F | Wt 149.0 lb

## 2016-01-23 DIAGNOSIS — R05 Cough: Secondary | ICD-10-CM

## 2016-01-23 DIAGNOSIS — J45990 Exercise induced bronchospasm: Secondary | ICD-10-CM | POA: Diagnosis not present

## 2016-01-23 DIAGNOSIS — R059 Cough, unspecified: Secondary | ICD-10-CM

## 2016-01-23 MED ORDER — ALBUTEROL SULFATE 108 (90 BASE) MCG/ACT IN AEPB: 2.0000 | INHALATION_SPRAY | Freq: Four times a day (QID) | RESPIRATORY_TRACT | Status: DC | PRN

## 2016-01-23 NOTE — Patient Instructions (Addendum)
He can use Flonase or Nasacort. Also use the inhaler as needed for the coughing and keep in touch

## 2016-01-23 NOTE — Progress Notes (Signed)
   Subjective:    Patient ID: Verlin FesterKelsi M Bernardini, female    DOB: 08/19/1995, 20 y.o.   MRN: [id number]  HPI She is here for evaluation of a cough. Originally she had issues several months ago with cough and wheezing. She was given Amoxil as well as a Z-Pak. She was eventually given oral steroids at another facility and apparently this got her back to normal. She does have an underlying history of exercise-induced asthma. She also has been on a nasal steroid but is not sure why she is taking it. In the last week she had the onset of headache as well as some nasal congestion and today developed a slight cough with wheezing. No sneezing, itchy water eyes, rhinorrhea, fever or chills, sore throat or earache..   Review of Systems     Objective:   Physical Exam Alert and in no distress. Tympanic membranes and canals are normal. Pharyngeal area is normal. Neck is supple without adenopathy or thyromegaly. Cardiac exam shows a regular sinus rhythm without murmurs or gallops. Lungs are clear to auscultation.        Assessment & Plan:  Exercise-induced asthma - Plan: Albuterol Sulfate (PROAIR RESPICLICK) 108 (90 Base) MCG/ACT AEPB  Cough - Plan: Albuterol Sulfate (PROAIR RESPICLICK) 108 (90 Base) MCG/ACT AEPB She definitely has a history of exercise-induced asthma however the allergy history is equivocal. Recommend she use the Proair as needed. I also recommend she use a topical OTC nasal steroid spray. If her symptoms get worse, she will follow-up here.

## 2016-02-12 ENCOUNTER — Telehealth: Payer: Self-pay | Admitting: Medical

## 2016-02-12 ENCOUNTER — Other Ambulatory Visit: Payer: Self-pay | Admitting: Medical

## 2016-02-12 MED ORDER — LISDEXAMFETAMINE DIMESYLATE 60 MG PO CAPS: 60.0000 mg | ORAL_CAPSULE | ORAL | Status: DC

## 2016-02-12 NOTE — Telephone Encounter (Signed)
Pt called and states that she needs a refill on her vyvanse states she is back in summer school and needs this to focus pt can be reached at [phone] (M) when ready to be picked up

## 2016-02-12 NOTE — Telephone Encounter (Signed)
rx ready 

## 2016-02-12 NOTE — Telephone Encounter (Signed)
Informed pt that rx was ready  °

## 2016-05-14 ENCOUNTER — Encounter: Payer: Self-pay | Admitting: Medical

## 2016-05-14 ENCOUNTER — Ambulatory Visit (INDEPENDENT_AMBULATORY_CARE_PROVIDER_SITE_OTHER): Payer: Managed Care, Other (non HMO) | Admitting: Medical

## 2016-05-14 ENCOUNTER — Other Ambulatory Visit: Payer: Self-pay | Admitting: Medical

## 2016-05-14 ENCOUNTER — Telehealth: Payer: Self-pay

## 2016-05-14 VITALS — BP 98/60 | HR 84 | Temp 98.4°F | Ht 69.75 in | Wt 141.2 lb

## 2016-05-14 DIAGNOSIS — J4530 Mild persistent asthma, uncomplicated: Secondary | ICD-10-CM | POA: Diagnosis not present

## 2016-05-14 DIAGNOSIS — J069 Acute upper respiratory infection, unspecified: Secondary | ICD-10-CM

## 2016-05-14 DIAGNOSIS — R05 Cough: Secondary | ICD-10-CM | POA: Diagnosis not present

## 2016-05-14 DIAGNOSIS — R059 Cough, unspecified: Secondary | ICD-10-CM

## 2016-05-14 MED ORDER — AZITHROMYCIN 250 MG PO TABS: ORAL_TABLET | ORAL | 0 refills | Status: DC

## 2016-05-14 MED ORDER — ALBUTEROL SULFATE 108 (90 BASE) MCG/ACT IN AEPB: 2.0000 | INHALATION_SPRAY | Freq: Four times a day (QID) | RESPIRATORY_TRACT | 1 refills | Status: DC | PRN

## 2016-05-14 MED ORDER — MOMETASONE FUROATE 220 MCG/INH IN AEPB: 1.0000 | INHALATION_SPRAY | Freq: Two times a day (BID) | RESPIRATORY_TRACT | 5 refills | Status: DC

## 2016-05-14 MED ORDER — BECLOMETHASONE DIPROPIONATE 80 MCG/ACT IN AERS: 2.0000 | INHALATION_SPRAY | Freq: Two times a day (BID) | RESPIRATORY_TRACT | 11 refills | Status: DC

## 2016-05-14 NOTE — Telephone Encounter (Signed)
Asmanex is not covered by pts insurance- only covers pulmicort and Qvar. Please send to [facility] on [address].

## 2016-05-14 NOTE — Telephone Encounter (Signed)
Pt made aware and verbalized understanding.

## 2016-05-14 NOTE — Progress Notes (Signed)
Subjective:  Sarah Davidson is a 20 y.o. female who presents for possible bronchitis.  She has hx/o asthma, and with illness can get sick quick.   She ran out of inhalers a few months ago when she moved apartments.  She has been having more asthma symptoms in general.   However, for last few days has cough, wheezing, SOB, congestion, post nasal drainage, but no fever, no NVD, no ear pain, no sore throat.   No sick contacts. Using some delsym.   She notes in general in recent months was using inhaler at least 1 night per week and at east 2-3 days per week.  No other aggravating or relieving factors. No other complaint.  The following portions of the patient's history were reviewed and updated as appropriate: allergies, current medications, past family history, past medical history, past social history, past surgical history and problem list.  ROS as in subjective  Past Medical History:  Diagnosis Date  . ADHD (attention deficit hyperactivity disorder) 2014  . Anxiety   . Asthma    Current Outpatient Prescriptions on File Prior to Visit  Medication Sig Dispense Refill  . lisdexamfetamine (VYVANSE) 60 MG capsule Take 1 capsule (60 mg total) by mouth every morning. 90 capsule 0  . SUMAtriptan (IMITREX) 50 MG tablet Take 1 tablet (50 mg total) by mouth every 2 (two) hours as needed for migraine. May repeat in 2 hours if headache persists or recurs. 10 tablet 2  . mometasone (NASONEX) 50 MCG/ACT nasal spray Place 2 sprays into the nose daily. Reported on 01/23/2016     Current Facility-Administered Medications on File Prior to Visit  Medication Dose Route Frequency Provider Last Rate Last Dose  . albuterol (PROVENTIL) (2.5 MG/3ML) 0.083% nebulizer solution 2.5 mg  2.5 mg Nebulization Once Kimberly-ClarkDavid S Tysinger, PA-C       ROS as in subjective   Objective: BP 98/60 (BP Location: Right Arm, Patient Position: Sitting, Cuff Size: Normal)   Pulse 84   Temp 98.4 F (36.9 C) (Oral)   Ht 5' 9.75" (1.772  m)   Wt 141 lb 3.2 oz (64 kg)   SpO2 98%   BMI 20.41 kg/m   General appearance: Alert, WD/WN, no distress, coughing a lot                             Skin: warm, no rash, no diaphoresis                           Head: no sinus tenderness                            Eyes: conjunctiva normal, corneas clear, PERRLA                            Ears: pearly TMs, external ear canals normal                          Nose: septum midline, turbinates swollen, with erythema and clear discharge             Mouth/throat: MMM, tongue normal, mild pharyngeal erythema                           Neck: supple, no  adenopathy, no thyromegaly, nontender                          Heart: RRR, normal S1, S2, no murmurs                         Lungs: +bronchial breath sounds, +scattered rhonchi, no wheezes, no rales                Extremities: no edema, nontender     Assessment: Encounter Diagnoses  Name Primary?  Marland Kitchen Asthma, mild persistent, uncomplicated Yes  . Acute upper respiratory infection   . Cough      Plan:  She will get flu shot with her mom where she gets it free.  Begin back on preventative inhaler Qvar, begin albuterol q4-6 hours now for URI, but then prn use.   If febrile, productive sputum, worse in the next few days, then begin zpak, otherwise, may just be viral URI exacerbating asthma.   Rest, hydrate well.  F/u if worse or not improving.

## 2016-05-14 NOTE — Telephone Encounter (Signed)
qvar sent instead. She can use this when she runs out of the Asmanex.

## 2016-07-16 ENCOUNTER — Encounter: Payer: Self-pay | Admitting: Medical

## 2016-07-16 ENCOUNTER — Ambulatory Visit (INDEPENDENT_AMBULATORY_CARE_PROVIDER_SITE_OTHER): Payer: Managed Care, Other (non HMO) | Admitting: Medical

## 2016-07-16 VITALS — BP 108/60 | HR 101 | Temp 101.0°F | Wt 143.2 lb

## 2016-07-16 DIAGNOSIS — R05 Cough: Secondary | ICD-10-CM | POA: Diagnosis not present

## 2016-07-16 DIAGNOSIS — J4540 Moderate persistent asthma, uncomplicated: Secondary | ICD-10-CM | POA: Diagnosis not present

## 2016-07-16 DIAGNOSIS — R059 Cough, unspecified: Secondary | ICD-10-CM | POA: Insufficient documentation

## 2016-07-16 DIAGNOSIS — J111 Influenza due to unidentified influenza virus with other respiratory manifestations: Secondary | ICD-10-CM | POA: Diagnosis not present

## 2016-07-16 DIAGNOSIS — J4541 Moderate persistent asthma with (acute) exacerbation: Secondary | ICD-10-CM | POA: Insufficient documentation

## 2016-07-16 MED ORDER — OSELTAMIVIR PHOSPHATE 75 MG PO CAPS: 75.0000 mg | ORAL_CAPSULE | Freq: Two times a day (BID) | ORAL | 0 refills | Status: DC

## 2016-07-16 MED ORDER — ALBUTEROL SULFATE 108 (90 BASE) MCG/ACT IN AEPB: 2.0000 | INHALATION_SPRAY | Freq: Four times a day (QID) | RESPIRATORY_TRACT | 1 refills | Status: DC | PRN

## 2016-07-16 NOTE — Progress Notes (Signed)
  Subjective:  Sarah Davidson is a 20 y.o. female who presents for possible influenza.   She notes almost 2 day hx/o head congestion, chest congestion, coughing, dry cough, fever, body aches, chills.    She has hx/o asthma, has some mild SOB, using albuterol BID.   No sick contacts.   Using some mucinex.  No sick contacts.  No other aggravating or relieving factors.  No other c/o.  The following portions of the patient's history were reviewed and updated as appropriate: allergies, current medications, past medical history, past social history and problem list.  ROS as in subjective    Past Medical History:  Diagnosis Date  . ADHD (attention deficit hyperactivity disorder) 2014  . Anxiety   . Asthma      Objective: BP 108/60   Pulse (!) 101   Temp (!) 101 F (38.3 C)   Wt 143 lb 3.2 oz (65 kg)   SpO2 99%   BMI 20.69 kg/m   General: Ill-appearing, well-developed, well-nourished Skin: Hot, dry HEENT: Nose inflamed and congested, clear conjunctiva, TMs pearly, no sinus tenderness, pharynx with erythema, no exudates Neck: Supple, non tender, shotty cervical adenopathy Heart: Regular rate and rhythm, normal S1, S2, no murmurs Lungs: Clear to auscultation bilaterally, no wheezes, rales, rhonchi Abdomen: Non tender non distended Extremities: Mild generalized tenderness   Assessment: Encounter Diagnoses  Name Primary?  . Influenza with respiratory manifestation Yes  . Cough   . Moderate persistent asthma, unspecified whether complicated      Plan: Prescription given for Tamiflu, discussed risks/benefits of medication.    Discussed diagnosis of influenza. Discussed supportive care including rest, hydration, OTC Tylenol or NSAID for fever, aches, and malaise.  C/t albuterol 2-3 times daily, and if not improving or having worse dyspnea, go to the ED.  Discussed period of contagion, self quarantine at home away from others to avoid spread of disease, discussed means of transmission,  and possible complications including pneumonia.  If worse or not improving within the next 4-5 days, then call or return.  Patient voiced understanding of diagnosis, recommendations, and treatment plan.  After visit summary given.  Gave note for work.

## 2016-07-16 NOTE — Patient Instructions (Signed)
You have the flu/Influenza   Use Ibuprofen 3 tablets 3 times daily for aches, fever, chills  Begin Tamiflu twice daily for flu symptoms  Rest  Hydrate well    Specific home care recommendations today include:  Only take over-the-counter (OTC) or prescription medicines for pain, discomfort, or fever as directed by your caregiver.    Decongestant: You may use OTC Guaifenesin (Mucinex plain) for congestion.  You may use Pseudoephedrine (Sudafed) only if you don't have blood pressure problems or a diagnosis of hypertension.  Cough suppression: If you have cough from drainage, you may use over-the-counter Dextromethorphan (Delsym) as directed on the label  Sore throat remedies:  You may use salt water gargles, warm fluids such as coffee or hot tea, or honey/tea/lemon mixture to sooth sore throat pain.  You may use OTC sore throat remedies such as Cepacol lozenges or Chloraseptic spray for sore throat pain.  Runny nose and sneezing remedies: You may use OTC antihistamine such as Zyrtec or Benadryl, but caution as these can cause drowsiness.    Pain/fever relief: You may use over-the-counter Tylenol for pain or fever  Drink extra fluids. Fluids help thin the mucus so your sinuses can drain more easily.   Applying either moist heat or ice packs to the sinus areas may help relieve discomfort.  Use saline nasal sprays to help moisten your sinuses. The sprays can be found at your local drugstore.    Please call or return if worse or not improving in the next few days.    Medication costs:  If you get to the pharmacy and medication prescribed today was either too expensive, not covered by your insurance, or required prior authorization, then please call us back to let us know.  We often have no way to know if a medication is too expensive or not covered by your insurance.  Thanks for your cooperation.   No Follow-up on file.    I have included other useful information below for your  review.   Influenza, Adult Influenza ("the flu") is a viral infection of the respiratory tract. It causes chills, fever, cough, headache, body aches, and sore throat. Influenza in general will make you feel sicker than when you have a common cold. Symptoms of the illness typically last a few days. Cough and fatigue may continue for as long as 7 to 10 days. Influenza is highly contagious. It spreads easily to others in the droplets from coughs and sneezes. People frequently become infected by touching something that was recently contaminated with the virus and then touch their mouth, nose or eyes. This infection is caused by a virus. Symptoms will not be reduced or improved by taking an antibiotic. Antibiotics are medications that kill bacteria, not viruses. DIAGNOSIS  Diagnosis of influenza is often made based on the history and physical examination as well as the presence of influenza reports occurring in your community. Testing can be done if the diagnosis is not certain. TREATMENT  Since influenza is caused by a virus, antibiotics are not helpful. Your caregiver may prescribe antiviral medicines to shorten the illness and lessen the severity. Your caregiver may also recommend influenza vaccination and/or antiviral medicines for your family members in order to prevent the spread of influenza to them. HOME CARE INSTRUCTIONS  DO NOT GIVE ASPIRIN TO PERSONS WITH INFLUENZA WHO ARE UNDER 22 YEARS OF AGE. This could lead to brain and liver damage (Reye's syndrome). Read the label on over-the-counter medicines.   Stay home from work  or school if at all possible until most of your symptoms are gone.   Only take over-the-counter or prescription medicines for pain, discomfort, or fever as directed by your caregiver.   Use a cool mist humidifier to increase air moisture. This will make breathing easier.   Rest until your temperature is nearly normal: 98.6 F (37 C). This usually takes 3 to 4 days. Be sure  you get plenty of rest.   Drink at least eight, eight-ounce glasses of fluids per day. Fluids include water, juice, broth, gelatin, or lemonade.   Cover your mouth and nose when coughing or sneezing and wash your hands often to prevent the spread of this virus to other persons.  PREVENTION  Annual influenza vaccination (flu shots) is the best way to avoid getting influenza. An annual flu shot is now routinely recommended for all adults in the [place] SEEK MEDICAL CARE IF:   You develop shortness of breath while resting.   You have a deep cough with production of mucous or chest pain.   You develop nausea (feeling sick to your stomach), vomiting, or diarrhea.  SEEK IMMEDIATE MEDICAL CARE IF:   You have difficulty breathing, become short of breath, or your skin or nails turn bluish.   You develop severe neck pain or stiffness.   You develop a severe headache, facial pain, or earache.   You have a fever.   You develop nausea or vomiting that cannot be controlled.  Document Released: 07/30/2000 Document Revised: 04/14/2011 Document Reviewed: 06/04/2009 [facility] [facility] Patient Information 2012 [employer] [place].

## 2016-07-19 ENCOUNTER — Encounter: Payer: Self-pay | Admitting: Medical

## 2016-07-19 ENCOUNTER — Ambulatory Visit (INDEPENDENT_AMBULATORY_CARE_PROVIDER_SITE_OTHER): Payer: Managed Care, Other (non HMO) | Admitting: Medical

## 2016-07-19 VITALS — BP 100/70 | HR 68 | Temp 98.1°F | Wt 142.2 lb

## 2016-07-19 DIAGNOSIS — J4540 Moderate persistent asthma, uncomplicated: Secondary | ICD-10-CM

## 2016-07-19 DIAGNOSIS — J111 Influenza due to unidentified influenza virus with other respiratory manifestations: Secondary | ICD-10-CM | POA: Diagnosis not present

## 2016-07-19 DIAGNOSIS — J029 Acute pharyngitis, unspecified: Secondary | ICD-10-CM | POA: Diagnosis not present

## 2016-07-19 MED ORDER — PREDNISONE 10 MG PO TABS: ORAL_TABLET | ORAL | 0 refills | Status: DC

## 2016-07-19 NOTE — Progress Notes (Signed)
Subjective: Chief Complaint  Patient presents with  . sore throat    sore throat    Here for f/u on flu, asthma, and sore throat.   I saw her last week for flu.  She started Tamiflu.   She does feel much better in regards to fever, body aches, chills. She is still quite SOB.   She is using albuterol 3-4 times daily.  Not using Asmanex although she has some at home from prior samples this year.   She is here today as she has a bad sore throat that began yesterday.  Has some lymph nodes swollen in neck.   No additional fever.  Using warm fluids.  No other aggravating or relieving factors. No other complaint.  Past Medical History:  Diagnosis Date  . ADHD (attention deficit hyperactivity disorder) 2014  . Anxiety   . Asthma    Current Outpatient Prescriptions on File Prior to Visit  Medication Sig Dispense Refill  . Albuterol Sulfate (PROAIR RESPICLICK) 108 (90 Base) MCG/ACT AEPB Inhale 2 puffs into the lungs 4 (four) times daily as needed. 1 each 1  . lisdexamfetamine (VYVANSE) 60 MG capsule Take 1 capsule (60 mg total) by mouth every morning. 90 capsule 0  . oseltamivir (TAMIFLU) 75 MG capsule Take 1 capsule (75 mg total) by mouth 2 (two) times daily. 10 capsule 0  . SUMAtriptan (IMITREX) 50 MG tablet Take 1 tablet (50 mg total) by mouth every 2 (two) hours as needed for migraine. May repeat in 2 hours if headache persists or recurs. 10 tablet 2  . mometasone (NASONEX) 50 MCG/ACT nasal spray Place 2 sprays into the nose daily. Reported on 01/23/2016     Current Facility-Administered Medications on File Prior to Visit  Medication Dose Route Frequency Provider Last Rate Last Dose  . albuterol (PROVENTIL) (2.5 MG/3ML) 0.083% nebulizer solution 2.5 mg  2.5 mg Nebulization Once Kimberly-ClarkDavid S Haytham Maher, PA-C       ROS as in subjective   Objective: BP 100/70   Pulse 68   Temp 98.1 F (36.7 C)   Wt 142 lb 3.2 oz (64.5 kg)   SpO2 97%   BMI 20.55 kg/m   General appearance: alert, no distress,  WD/WN HEENT: normocephalic, sclerae anicteric, TMs pearly, nares patent, no discharge or erythema, pharynx with post nasal drainage, mild erythema Oral cavity: MMM, no lesions Neck: supple, shoddy anterior nodes, no thyromegaly, no masses Heart: RRR, normal S1, S2, no murmurs Lungs: few wheezes, +rhonchi, decreased breath sounds, no rales    Assessment: Encounter Diagnoses  Name Primary?  . Influenza with respiratory manifestation Yes  . Moderate persistent asthma, unspecified whether complicated   . Sore throat      Plan: Improving in regards to flu. Is on Tamiflu. discussed supportive care for sore throat and post nasal drainage.   Gave 1 round of albuterol neb treatment in the office today.   No major improvement.  C/t rest, hydration, supportive care, Ibuprofen OTC, finish Tamiflu course, begin back on Asmanex for the next 2-3 weeks.  C/t albuterol HFA q4-6 hours.  Wrote script for spacer.   Begin prednisone taper if not improving in the next 48 hours.  discussed risks/benefits.     F/u prn.

## 2016-10-13 ENCOUNTER — Other Ambulatory Visit: Payer: Managed Care, Other (non HMO)

## 2016-10-15 ENCOUNTER — Other Ambulatory Visit (INDEPENDENT_AMBULATORY_CARE_PROVIDER_SITE_OTHER): Payer: Managed Care, Other (non HMO)

## 2016-10-15 DIAGNOSIS — Z23 Encounter for immunization: Secondary | ICD-10-CM

## 2016-10-18 LAB — TB SKIN TEST
Induration: 0 mm
TB Skin Test: NEGATIVE

## 2017-08-12 ENCOUNTER — Encounter: Payer: Self-pay | Admitting: Medical

## 2017-08-12 ENCOUNTER — Ambulatory Visit: Payer: Managed Care, Other (non HMO) | Admitting: Medical

## 2017-08-12 VITALS — BP 108/60 | HR 86 | Temp 98.2°F | Wt 152.2 lb

## 2017-08-12 DIAGNOSIS — J4540 Moderate persistent asthma, uncomplicated: Secondary | ICD-10-CM

## 2017-08-12 DIAGNOSIS — R062 Wheezing: Secondary | ICD-10-CM | POA: Diagnosis not present

## 2017-08-12 MED ORDER — ALBUTEROL SULFATE HFA 108 (90 BASE) MCG/ACT IN AERS: 2.0000 | INHALATION_SPRAY | Freq: Four times a day (QID) | RESPIRATORY_TRACT | 1 refills | Status: DC | PRN

## 2017-08-12 MED ORDER — FLUTICASONE FUROATE-VILANTEROL 100-25 MCG/INH IN AEPB: 1.0000 | INHALATION_SPRAY | Freq: Every day | RESPIRATORY_TRACT | 2 refills | Status: DC

## 2017-08-12 MED ORDER — ALBUTEROL SULFATE (2.5 MG/3ML) 0.083% IN NEBU: 2.5000 mg | INHALATION_SOLUTION | Freq: Once | RESPIRATORY_TRACT | Status: DC

## 2017-08-12 MED ORDER — ALBUTEROL SULFATE (2.5 MG/3ML) 0.083% IN NEBU: 2.5000 mg | INHALATION_SOLUTION | Freq: Four times a day (QID) | RESPIRATORY_TRACT | 2 refills | Status: DC | PRN

## 2017-08-12 NOTE — Progress Notes (Signed)
Subjective:  Sarah Davidson is a 21 y.o. female who presents for asthma.  She has a history of mild to moderate persistent asthma.  In the past several months she has not had to use the inhaler much at all except for exercise however.  She was on preventative inhaler last December  She is here today for 3-day history of wheezing, congestion, coughing.  She does not feel sick in general just cannot breathe.  She has recently house that with her friends dogs and has been around her father who smokes.  She also notes that cold weather seems to trigger her asthma.  Denies fever, productive.  Mucus, sore throat, ear pain, body aches or chills.  She is using her albuterol inhaler several times a day, 4+.  She could not breathe well on the way over here.  No other aggravating or relieving factors.  No other c/o.  The following portions of the patient's history were reviewed and updated as appropriate: allergies, current medications, past family history, past medical history, past social history, past surgical history and problem list.  ROS as in subjective  Past Medical History:  Diagnosis Date  . ADHD (attention deficit hyperactivity disorder) 2014  . Anxiety   . Asthma    Current Outpatient Medications on File Prior to Visit  Medication Sig Dispense Refill  . Albuterol Sulfate (PROAIR RESPICLICK) 332 (90 Base) MCG/ACT AEPB Inhale 2 puffs into the lungs 4 (four) times daily as needed. 1 each 1   Current Facility-Administered Medications on File Prior to Visit  Medication Dose Route Frequency Provider Last Rate Last Dose  . albuterol (PROVENTIL) (2.5 MG/3ML) 0.083% nebulizer solution 2.5 mg  2.5 mg Nebulization Once Catori Panozzo, Camelia Eng, PA-C          Objective: BP 108/60   Pulse 86   Temp 98.2 F (36.8 C)   Wt 152 lb 3.2 oz (69 kg)   SpO2 96%   BMI 22.00 kg/m   General appearance: Alert, WD/WN, no distress                             Skin: warm, no rash, no diaphoresis           Head: no sinus tenderness                            Eyes: conjunctiva normal, corneas clear, PERRLA                            Ears: pearly TMs, external ear canals normal                          Nose: septum midline, turbinates swollen, with erythema and clear discharge             Mouth/throat: MMM, tongue normal, no pharyngeal erythema                           Neck: supple, no adenopathy, no thyromegaly, nontender                          Heart: RRR, normal S1, S2, no murmurs  Lungs: +wheezing walking down the hall.   After neb, clear breath sounds.                 Extremities: no edema, nontender      Assessment: Encounter Diagnoses  Name Primary?  . Wheezing Yes  . Moderate persistent asthma, unspecified whether complicated      Plan:  Her recent exposure symptoms and exam suggest asthma flare due to triggers in the atmosphere and cold weather.  It does not appear that she has acute illness.  Upon initial presentation we did a round of nebulized albuterol with significantly improved her symptoms.  I wrote a prescription for a home nebulizer kit today.  Patient Instructions  Recommendations:  Begin Breo inhaler (preventative inhaler), 1 puff daily.  Continue Breo through mid February  Rinse mouth out with water after use  You may use Albuterol hand held inhaler or the nebulized albuterol every 4-6 hours as needed  You can use the OTC antihistamine for at least the next 2 weeks  If your local pharmacy doesn't carry the nebulizer, try [facility] or other Medical supply store  If you are worse or not improving call the after hours line or recheck    [name] was seen today for coughing.  Diagnoses and all orders for this visit:  Wheezing -     albuterol (PROVENTIL) (2.5 MG/3ML) 0.083% nebulizer solution 2.5 mg  Moderate persistent asthma, unspecified whether complicated  Other orders -     albuterol (PROVENTIL HFA;VENTOLIN HFA) 108 (90  Base) MCG/ACT inhaler; Inhale 2 puffs into the lungs every 6 (six) hours as needed for wheezing or shortness of breath. -     albuterol (PROVENTIL) (2.5 MG/3ML) 0.083% nebulizer solution; Take 3 mLs (2.5 mg total) by nebulization every 6 (six) hours as needed for wheezing or shortness of breath. -     fluticasone furoate-vilanterol (BREO ELLIPTA) 100-25 MCG/INH AEPB; Inhale 1 puff into the lungs daily.   F/u with call back 1wk

## 2017-08-12 NOTE — Patient Instructions (Signed)
Recommendations:  Begin Breo inhaler (preventative inhaler), 1 puff daily.  Continue Breo through mid February  Rinse mouth out with water after use  You may use Albuterol hand held inhaler or the nebulized albuterol every 4-6 hours as needed  You can use the OTC antihistamine for at least the next 2 weeks  If your local pharmacy doesn't carry the nebulizer, [facility] or other [facility] supply store  If you are worse or not improving call the after hours line or recheck

## 2018-03-20 ENCOUNTER — Ambulatory Visit: Payer: Managed Care, Other (non HMO) | Admitting: Family Medicine

## 2018-03-20 ENCOUNTER — Encounter: Payer: Self-pay | Admitting: Family Medicine

## 2018-03-20 VITALS — BP 118/70 | HR 90 | Temp 98.4°F | Resp 16 | Ht 70.0 in | Wt 165.4 lb

## 2018-03-20 DIAGNOSIS — J309 Allergic rhinitis, unspecified: Secondary | ICD-10-CM | POA: Diagnosis not present

## 2018-03-20 DIAGNOSIS — J4531 Mild persistent asthma with (acute) exacerbation: Secondary | ICD-10-CM

## 2018-03-20 MED ORDER — ALBUTEROL SULFATE (2.5 MG/3ML) 0.083% IN NEBU: 2.5000 mg | INHALATION_SOLUTION | Freq: Four times a day (QID) | RESPIRATORY_TRACT | 0 refills | Status: DC | PRN

## 2018-03-20 MED ORDER — PREDNISONE 10 MG (21) PO TBPK: ORAL_TABLET | ORAL | 0 refills | Status: DC

## 2018-03-20 MED ORDER — FLUTICASONE PROPIONATE 50 MCG/ACT NA SUSP: 2.0000 | Freq: Every day | NASAL | 6 refills | Status: DC

## 2018-03-20 MED ORDER — ALBUTEROL SULFATE HFA 108 (90 BASE) MCG/ACT IN AERS: 2.0000 | INHALATION_SPRAY | Freq: Four times a day (QID) | RESPIRATORY_TRACT | 1 refills | Status: DC | PRN

## 2018-03-20 NOTE — Patient Instructions (Signed)
Take the prednisone course and use the albuterol as needed. Return in 2 weeks to see Vincenza HewsShane. Will need spirometry to help assess whether or not preventative medications are needed.  If you have fever, productive cough, or other changes, please return for recheck.  You may want to restart regular use of Flonase to help with allergies/congestion.

## 2018-03-20 NOTE — Progress Notes (Signed)
Chief Complaint  Patient presents with  . asthma    asthma, coughing SOB   Patient presents with asthma flare.  Needing to use nebulizer three times daily for the last week.  She hears wheezing, is coughing a lot--nonproductive. Denies sneezing, runny nose, allergies.  Has some nasal congestion, using an OTC spray (not a steroid) at bedtime so she can breathe through her nose (or else more dry throat and cough the next day).  Woke up at 1 and 5 this morning with coughing spells.  She lost her albuterol inhaler. Nebulizer helps.  Bartending more recently (some smoke exposure from patio, and from dad); no new environmental changes (has had dog for a year, sleeps on her bed).  Previously took [place], and other preventative, not in a long time. Mostly comes for acute visits, hasn't had spirometry here.  PMH, PSH, SH reviewed  Last neb at 6am  Current Outpatient Medications on File Prior to Visit  Medication Sig Dispense Refill  . fluticasone furoate-vilanterol (BREO ELLIPTA) 100-25 MCG/INH AEPB Inhale 1 puff into the lungs daily. (Patient not taking: Reported on 03/20/2018) 28 each 2   Current Facility-Administered Medications on File Prior to Visit  Medication Dose Route Frequency Provider Last Rate Last Dose  . albuterol (PROVENTIL) (2.5 MG/3ML) 0.083% nebulizer solution 2.5 mg  2.5 mg Nebulization Once Tysinger, Kermit Balo, PA-C      . albuterol (PROVENTIL) (2.5 MG/3ML) 0.083% nebulizer solution 2.5 mg  2.5 mg Nebulization Once Tysinger, Kermit Balo, PA-C       Allergies  Allergen Reactions  . Sulfa Antibiotics    ROS:  No fever, chills. Some congestion as per HPI. Denies heartburn, nausea, bowel changes, bleeding, bruising, rash. No chest pain.  +cough/wheeze per HPI. Not sexually active; LMP just finished.  PHYSICAL EXAM: BP 118/70   Pulse 90   Temp 98.4 F (36.9 C) (Oral)   Resp 16   Ht 5\' 10"  (1.778 m)   Wt 165 lb 6.4 oz (75 kg)   SpO2 97%   BMI 23.73 kg/m   Well appearing,  pleasant female, in good spirits, speaking easily in no distress.  Mild/occasional dry cough with deep breaths only. HEENT: conjunctiva and sclera are clear, EOMI. TM's and EAC's normal, OP is clear. Sinuses nontender. Nasal mucosa is mod-severely edematous on the left, mildly edematous on the right. Neck: no mass. Shotty lymphadenopathy bilaterally, nontender. Heart: regular rate and rhythm Lungs: slightly coarse with slight prolongation of expiratory phase, with some dry cough intermittently with expiration.  No actual wheezing heard. No rales, ronchi. Skin: normal turgor, no rash Extremities: no clubbing, cyanosis or edema Psych: normal mood, affect, hygiene and grooming Neuro: alert and oriented, cranial nerves intact, normal gait  PF 440/410/445 (goal around 490)  ASSESSMENT/PLAN:  Mild persistent asthma with exacerbation - discussed potential triggers, including allergies (based on exam); treat with prednisone, f/u in 2 weeks for spirometry - Plan: albuterol (PROVENTIL HFA;VENTOLIN HFA) 108 (90 Base) MCG/ACT inhaler, albuterol (PROVENTIL) (2.5 MG/3ML) 0.083% nebulizer solution, predniSONE (STERAPRED UNI-PAK 21 TAB) 10 MG (21) TBPK tablet  Allergic rhinitis, unspecified seasonality, unspecified trigger - reviewed potential triggers (dog, smoke, mildew/mold) and use of antihistamines, +- flonase - Plan: fluticasone (FLONASE) 50 MCG/ACT nasal spray  Risks/side effects of prednisone reviewed in detail. Refilled inhaler to keep with her, also RF neb.  Return in 2 weeks to see Vincenza Hews. Will need spirometry to help assess whether or not preventative medications are needed.  If you have fever, productive cough,  or other changes, please return for recheck.  You may want to restart regular use of Flonase to help with allergies/congestion.

## 2018-04-04 ENCOUNTER — Ambulatory Visit: Payer: Managed Care, Other (non HMO) | Admitting: Medical

## 2018-04-04 VITALS — BP 110/70 | HR 88 | Wt 169.6 lb

## 2018-04-04 DIAGNOSIS — R05 Cough: Secondary | ICD-10-CM

## 2018-04-04 DIAGNOSIS — J301 Allergic rhinitis due to pollen: Secondary | ICD-10-CM

## 2018-04-04 DIAGNOSIS — J4531 Mild persistent asthma with (acute) exacerbation: Secondary | ICD-10-CM | POA: Diagnosis not present

## 2018-04-04 DIAGNOSIS — J4540 Moderate persistent asthma, uncomplicated: Secondary | ICD-10-CM

## 2018-04-04 DIAGNOSIS — R059 Cough, unspecified: Secondary | ICD-10-CM

## 2018-04-04 MED ORDER — ALBUTEROL SULFATE (2.5 MG/3ML) 0.083% IN NEBU: 2.5000 mg | INHALATION_SOLUTION | Freq: Four times a day (QID) | RESPIRATORY_TRACT | 2 refills | Status: DC | PRN

## 2018-04-04 MED ORDER — FLUTICASONE FUROATE-VILANTEROL 100-25 MCG/INH IN AEPB: 1.0000 | INHALATION_SPRAY | Freq: Every day | RESPIRATORY_TRACT | 5 refills | Status: DC

## 2018-04-04 MED ORDER — ALBUTEROL SULFATE HFA 108 (90 BASE) MCG/ACT IN AERS: 2.0000 | INHALATION_SPRAY | Freq: Four times a day (QID) | RESPIRATORY_TRACT | 1 refills | Status: DC | PRN

## 2018-04-04 NOTE — Progress Notes (Signed)
Subjective: Chief Complaint  Patient presents with  . med check    med check   Here for follow-up on asthma.  She saw Dr. Lynelle DoctorKnapp here within the last 2 weeks for the same.  Since December 2018 she has not had much flareup including did pretty well over spring.  She notes after getting her nebulizer for home use in December this made a big difference.  She has on average having to use her albuterol daily for the past month but prior to the last month rarely had to use her albuterol since December.  She notes the one time she did not have her albuterol with her recently at the golf tournament she had an asthma flare.  She is also been around more smokers of late.  Using Flonase but  does not feel like is helping that much currently.  She is taking the steroid prescribed last week but still is having quite a bit of coughing and tightness in the chest.  She is supposed to have a PFT done today but still feels quite tight in the chest.  She notes that Breo inhaler for prevention has worked well for this past year.  No other aggravating or relieving factors. No other complaint.  Past Medical History:  Diagnosis Date  . ADHD (attention deficit hyperactivity disorder) 2014  . Anxiety   . Asthma    Current Outpatient Medications on File Prior to Visit  Medication Sig Dispense Refill  . fluticasone (FLONASE) 50 MCG/ACT nasal spray Place 2 sprays into both nostrils daily. 16 g 6   Current Facility-Administered Medications on File Prior to Visit  Medication Dose Route Frequency Provider Last Rate Last Dose  . albuterol (PROVENTIL) (2.5 MG/3ML) 0.083% nebulizer solution 2.5 mg  2.5 mg Nebulization Once Deavion Strider S, PA-C      . albuterol (PROVENTIL) (2.5 MG/3ML) 0.083% nebulizer solution 2.5 mg  2.5 mg Nebulization Once Saleena Tamas, Kermit Baloavid S, PA-C       ROS as in subjective    Objective: BP 110/70   Pulse 88   Wt 169 lb 9.6 oz (76.9 kg)   BMI 24.34 kg/m   General appearence: alert, no distress,  WD/WN,  HEENT: normocephalic, sclerae anicteric, TMs pearly, nares with turbinated edema, R>L, clear discharge, no erythema, pharynx normal Oral cavity: MMM, no lesions Neck: supple, no lymphadenopathy, no thyromegaly, no masses Heart: RRR, normal S1, S2, no murmurs Lungs: decreaed breath sounds, no wheezes, rhonchi, or rales Pulses: 2+ symmetric, upper and lower extremities, normal cap refill     Assessment: Encounter Diagnoses  Name Primary?  . Moderate persistent asthma, unspecified whether complicated Yes  . Cough   . Allergic rhinitis due to pollen, unspecified seasonality   . Mild persistent asthma with exacerbation      Plan: Restart Breo preventative inhaler, continue albuterol as needed, discussed proper use of medications, difference between preventative and rescue medications.  Continue Flonase.  If not feeling much improved within the next 2 weeks consider adding Singulair or increasing Breo dose or possibly using using Pulmicort for nebulizer at home.  Advise she call or return in 2 weeks to give me an update on symptoms  She plans to get a flu shot at work free soon    Riley ChurchesKelsi was seen today for med check.  Diagnoses and all orders for this visit:  Moderate persistent asthma, unspecified whether complicated  Cough  Allergic rhinitis due to pollen, unspecified seasonality  Mild persistent asthma with exacerbation Comments: discussed potential  triggers, including allergies (based on exam); treat with prednisone, f/u in 2 weeks for spirometry Orders: -     albuterol (PROVENTIL) (2.5 MG/3ML) 0.083% nebulizer solution; Take 3 mLs (2.5 mg total) by nebulization every 6 (six) hours as needed for wheezing or shortness of breath. -     albuterol (PROVENTIL HFA;VENTOLIN HFA) 108 (90 Base) MCG/ACT inhaler; Inhale 2 puffs into the lungs every 6 (six) hours as needed for wheezing or shortness of breath.  Other orders -     fluticasone furoate-vilanterol (BREO ELLIPTA)  100-25 MCG/INH AEPB; Inhale 1 puff into the lungs daily.

## 2018-04-05 ENCOUNTER — Other Ambulatory Visit: Payer: Self-pay | Admitting: Medical

## 2018-04-05 DIAGNOSIS — J4531 Mild persistent asthma with (acute) exacerbation: Secondary | ICD-10-CM

## 2018-04-05 NOTE — Telephone Encounter (Signed)
[facility]. Please advise KH 

## 2018-05-11 ENCOUNTER — Encounter: Payer: Self-pay | Admitting: Family Medicine

## 2018-05-11 ENCOUNTER — Ambulatory Visit: Payer: Managed Care, Other (non HMO) | Admitting: Family Medicine

## 2018-05-11 VITALS — BP 100/60 | HR 80 | Temp 98.6°F | Ht 70.0 in | Wt 172.6 lb

## 2018-05-11 DIAGNOSIS — J302 Other seasonal allergic rhinitis: Secondary | ICD-10-CM

## 2018-05-11 DIAGNOSIS — J4541 Moderate persistent asthma with (acute) exacerbation: Secondary | ICD-10-CM

## 2018-05-11 DIAGNOSIS — J069 Acute upper respiratory infection, unspecified: Secondary | ICD-10-CM

## 2018-05-11 MED ORDER — FLUTICASONE FUROATE-VILANTEROL 100-25 MCG/INH IN AEPB: 1.0000 | INHALATION_SPRAY | Freq: Every day | RESPIRATORY_TRACT | 2 refills | Status: DC

## 2018-05-11 MED ORDER — PREDNISONE 20 MG PO TABS: 20.0000 mg | ORAL_TABLET | Freq: Two times a day (BID) | ORAL | 0 refills | Status: DC

## 2018-05-11 NOTE — Patient Instructions (Signed)
  Take Prednisone 20mg  BID x 5 days. Use decongestants if needed (claritin D or separate sudafed) Start Mucinex (plain or extra strength, 12 hour, twice daily). You can use Delsym syrup as a cough suppressant, if needed. We didn't give nebulizer treatment today due to lack of time (your dentist appt), but I would use your inhaler prior to your appointment, and nebulizer later today. Restart Flonase and use this daily, regularly to treat allergies. We are increasing your Breo dose to the 200mg  dose. Start this now--save the lower dose, you may be able to taper back to that in the future.

## 2018-05-11 NOTE — Progress Notes (Signed)
Chief Complaint  Patient presents with  . Nasal Congestion    started yesterday. Had PND Monday and worsened yesterday. Treated for ashtma flare 3 weeks ago. Notb bringing up any mucus-no fevers.   Head and nasal congestion started 2 days ago, along with PND, sore throat.  Throat improved, still has face congestion, and presents today with increased chest congestion.  Feels it "rumbling when she breathes", waking her up at night, feeling short of breath.  She is coughing, not productive.  Has been using her albuterol 3x/day for the last few days.  She wakes up at night, laying flat, with chest pressure and needs to use inhaler.  Chest congestion started 4 days ago, got worse 2 days ago.  She thought this was a cold at first Mucus has been clear (was a little discolored a few days ago).  She has asthma, seen by me for flare in early August, treated with steroid taper.  She saw Vincenza Hews in f/u afterwards, still not better. She was to f/u in 2 weeks with him (due for spirometry), but di dnot  She will be getting flu shot today elsewhere.  PMH, PSH, SH reviewed and updated  Outpatient Encounter Medications as of 05/11/2018  Medication Sig Note  . albuterol (PROVENTIL) (2.5 MG/3ML) 0.083% nebulizer solution Take 3 mLs (2.5 mg total) by nebulization every 6 (six) hours as needed for wheezing or shortness of breath.   . fluticasone furoate-vilanterol (BREO ELLIPTA) 100-25 MCG/INH AEPB Inhale 1 puff into the lungs daily.   . phenylephrine (SUDAFED PE) 10 MG TABS tablet Take 10 mg by mouth every 4 (four) hours as needed.   Marland Kitchen PROAIR HFA 108 (90 Base) MCG/ACT inhaler TAKE 2 PUFFS BY MOUTH EVERY 6 HOURS AS NEEDED FOR WHEEZE OR SHORTNESS OF BREATH 05/11/2018: Needing 3x/day over the last few days (usually BID)  . fluticasone (FLONASE) 50 MCG/ACT nasal spray Place 2 sprays into both nostrils daily. (Patient not taking: Reported on 05/11/2018) 05/11/2018: Using on/off x 2 months (not daily); ran out last week    Facility-Administered Encounter Medications as of 05/11/2018  Medication  . albuterol (PROVENTIL) (2.5 MG/3ML) 0.083% nebulizer solution 2.5 mg  . albuterol (PROVENTIL) (2.5 MG/3ML) 0.083% nebulizer solution 2.5 mg   ROS: no fever, chills, headaches, dizziness, chest pain.  +cough, URI symptoms, chest congestion, wheezing and dyspnea per HPI. No nausea, vomiting, diarrhea.  PHYSICAL EXAM:  BP 100/60   Pulse 80   Temp 98.6 F (37 C) (Tympanic)   Ht 5\' 10"  (1.778 m)   Wt 172 lb 9.6 oz (78.3 kg)   LMP 05/06/2018   BMI 24.77 kg/m   Well-appearing, pleasant female, speaking easily in full sentences, in no distress.   HEENT: conjunctiva and sclera are clear, EOMI.  TM's and EAC's normal. Nasal mucosa is mod edematous R>L, clear mucus on left Sinuses nontender. OP with mild erythema of anterior tonsillar pillars; cobblestoning noted posteriorly Neck: shotty lymphadenopathy in anterior cervical chain, nontender Heart: regular rate and rhythm, no murmur Lungs:  Decreased breath sounds with significant diffuse wheezing. Skin: normal turgor, no rash Neuro: alert and oriented, cranial nerves intact, normal gait Psych: normal mood, affect, hygiene and grooming  ASSESSMENT/PLAN:   Moderate persistent asthma with exacerbation - tight/wheezy today; declines neb due to time constraints; declines IM steroids.  pred 20mg  BID x 5d; increase Breo dose. f/u with Vincenza Hews - Plan: predniSONE (DELTASONE) 20 MG tablet, fluticasone furoate-vilanterol (BREO ELLIPTA) 100-25 MCG/INH AEPB  Viral upper respiratory infection - supportive  measures reviewed; s/sx bacterial infection, to f/u if develop for ABX  Seasonal allergic rhinitis, unspecified trigger - likely also has underlying allergies; instructed on the need for regular use of Flonase. Consider adding singulair if asthma/allergies not improving.  Risks/side effects of steroids reviewed. Declined albuterol neb--has a dentist appt in [place],  doesn't want to be late.  Advised to use inhaler prior to that visit (having dental extraction, may get worse laying supine).  Discussed allergy treatments, antihistamines. Increase Breo dose Treat with prednisone x 5d F/u with Vincenza Hews in 1-2 weeks, sooner prn if not improving. Risks/SE of meds reviewed  URI (vs also allergies) Contributing to asthma flare.  Take Prednisone 20mg  BID x 5 days. Use decongestants if needed (claritin D or separate sudafed) Start Mucinex (plain or extra strength, 12 hour, twice daily). You can use Delsym syrup as a cough suppressant, if needed. We didn't give nebulizer treatment today due to lack of time (your dentist appt), but I would use your inhaler prior to your appointment, and nebulizer later today. Restart Flonase and use this daily, regularly to treat allergies. We are increasing your Breo dose to the 200mg  dose. Start this now--save the lower dose, you may be able to taper back to that in the future.

## 2018-05-23 ENCOUNTER — Ambulatory Visit
Admission: RE | Admit: 2018-05-23 | Discharge: 2018-05-23 | Disposition: A | Discharge status: Home / Self Care | Payer: Managed Care, Other (non HMO) | Source: Ambulatory Visit | Attending: Medical | Admitting: Medical

## 2018-05-23 ENCOUNTER — Ambulatory Visit: Payer: Managed Care, Other (non HMO) | Admitting: Medical

## 2018-05-23 ENCOUNTER — Encounter: Payer: Self-pay | Admitting: Medical

## 2018-05-23 VITALS — BP 110/70 | HR 101 | Temp 98.1°F | Resp 16 | Ht 70.0 in | Wt 170.8 lb

## 2018-05-23 DIAGNOSIS — R062 Wheezing: Secondary | ICD-10-CM

## 2018-05-23 DIAGNOSIS — R05 Cough: Secondary | ICD-10-CM | POA: Diagnosis not present

## 2018-05-23 DIAGNOSIS — J4541 Moderate persistent asthma with (acute) exacerbation: Secondary | ICD-10-CM

## 2018-05-23 DIAGNOSIS — Z7185 Encounter for immunization safety counseling: Secondary | ICD-10-CM | POA: Insufficient documentation

## 2018-05-23 DIAGNOSIS — J4531 Mild persistent asthma with (acute) exacerbation: Secondary | ICD-10-CM

## 2018-05-23 DIAGNOSIS — Z7189 Other specified counseling: Secondary | ICD-10-CM | POA: Diagnosis not present

## 2018-05-23 DIAGNOSIS — R059 Cough, unspecified: Secondary | ICD-10-CM

## 2018-05-23 MED ORDER — MONTELUKAST SODIUM 10 MG PO TABS
10.0000 mg | ORAL_TABLET | Freq: Every day | ORAL | 11 refills | Status: DC
Start: 2018-05-23 — End: 2021-10-06

## 2018-05-23 MED ORDER — ALBUTEROL SULFATE HFA 108 (90 BASE) MCG/ACT IN AERS: INHALATION_SPRAY | RESPIRATORY_TRACT | 1 refills | Status: DC

## 2018-05-23 MED ORDER — FLUTICASONE FUROATE-VILANTEROL 200-25 MCG/INH IN AEPB: 1.0000 | INHALATION_SPRAY | Freq: Every day | RESPIRATORY_TRACT | 5 refills | Status: DC

## 2018-05-23 NOTE — Progress Notes (Signed)
Subjective: Chief Complaint  Patient presents with  . chest pain    chest pain related to cough    here for cough and chest discomfort.   Saw Dr. Lynelle Doctor in 05/11/18 for flareup.  At that time Virgel Bouquet was supposed to be increased but when she got to the pharmacy the higher dose was not there.  So she is continued on the 100 mg dose.  She went to urgent care 3 days after seeing Dr. Lynelle Doctor, was given zpak.  Felt better, sinus improved, but she is continued to have cough.  In the last several days though hurts in the chest every time she laughs or coughs or hiccups.  No hemoptysis.  Not on birth control, no prior PE/DVT.  She does have home nebulizer, usually yesterday.  No fever, no nausea vomiting, no recent long travel, no recent fall or trauma or injury.No other aggravating or relieving factors. No other complaint.    Past Medical History:  Diagnosis Date  . ADHD (attention deficit hyperactivity disorder) 2014  . Anxiety   . Asthma    Current Outpatient Medications on File Prior to Visit  Medication Sig Dispense Refill  . albuterol (PROVENTIL) (2.5 MG/3ML) 0.083% nebulizer solution Take 3 mLs (2.5 mg total) by nebulization every 6 (six) hours as needed for wheezing or shortness of breath. 75 mL 2  . fluticasone (FLONASE) 50 MCG/ACT nasal spray Place 2 sprays into both nostrils daily. 16 g 6  . phenylephrine (SUDAFED PE) 10 MG TABS tablet Take 10 mg by mouth every 4 (four) hours as needed.     Current Facility-Administered Medications on File Prior to Visit  Medication Dose Route Frequency Provider Last Rate Last Dose  . albuterol (PROVENTIL) (2.5 MG/3ML) 0.083% nebulizer solution 2.5 mg  2.5 mg Nebulization Once Tysinger, David S, PA-C      . albuterol (PROVENTIL) (2.5 MG/3ML) 0.083% nebulizer solution 2.5 mg  2.5 mg Nebulization Once Tysinger, Kermit Balo, PA-C       ROS as in subjective   Objective: BP 110/70   Pulse (!) 101   Temp 98.1 F (36.7 C) (Oral)   Resp 16   Ht 5\' 10"  (1.778 m)    Wt 170 lb 12.8 oz (77.5 kg)   LMP 05/06/2018   SpO2 96%   BMI 24.51 kg/m   General appearance: alert, no distress, WD/WN,  HEENT: normocephalic, sclerae anicteric, TMs pearly, nares patent, no discharge or erythema, pharynx normal Oral cavity: MMM, no lesions Neck: supple, no lymphadenopathy, no thyromegaly, no masses Heart: RRR, normal S1, S2, no murmurs Lungs: scattered  Wheezes,+ rhonchi, no rales, chest nontender Ext: no edema, no calve tendnerss or swelling, neg homans Pulses: 2+ symmetric, upper and lower extremities, normal cap refill   Assessment: Encounter Diagnoses  Name Primary?  . Moderate persistent asthma with exacerbation Yes  . Cough   . Wheezing   . Vaccine counseling   . Mild persistent asthma with exacerbation     Plan: After initial eval given nebulized treatment with albuterol which gave some improvement PFT today showing moderate airway obstruction, will send for chest x-ray And Singulair, increase Breo to 200 which may have been inadvertently not sent to pharmacy last visit Continue albuterol as needed particular in the next several days due to flareup She will be getting her flu shot next week with her mom, has a free voucher to do this elsewhere Interestingly she had a tobacco odor but denied smoking currently   Sarah Davidson was seen today  for chest pain.  Diagnoses and all orders for this visit:  Moderate persistent asthma with exacerbation -     Spirometry with Graph -     DG Chest 2 View; Future -     Pulse oximetry (single); Future  Cough -     DG Chest 2 View; Future -     Pulse oximetry (single); Future  Wheezing -     DG Chest 2 View; Future -     Pulse oximetry (single); Future  Vaccine counseling  Mild persistent asthma with exacerbation Comments: discussed potential triggers, including allergies (based on exam); treat with prednisone, f/u in 2 weeks for spirometry Orders: -     albuterol (PROAIR HFA) 108 (90 Base) MCG/ACT inhaler;  TAKE 2 PUFFS BY MOUTH EVERY 6 HOURS AS NEEDED FOR WHEEZE OR SHORTNESS OF BREATH  Other orders -     montelukast (SINGULAIR) 10 MG tablet; Take 1 tablet (10 mg total) by mouth at bedtime. -     fluticasone furoate-vilanterol (BREO ELLIPTA) 200-25 MCG/INH AEPB; Inhale 1 puff into the lungs daily.

## 2018-05-30 ENCOUNTER — Telehealth: Payer: Self-pay | Admitting: Medical

## 2018-05-30 NOTE — Telephone Encounter (Signed)
Pt called back & states no better, still in pain, can't breath, hiccup or cough without sharp pain.  Wants to know if she needs to be seen again or what she can do next?

## 2018-05-30 NOTE — Telephone Encounter (Signed)
I assume she is using her nebulized treatment 3 times a day at least.  If not, needs to be doing this  At the very least I recommend a nurse visit to recheck vitals.  If her pulse rate is still elevated I will likely either do a d-dimer blood test or or we could see her in case we feel like we need to do a CT scan or other evaluation  Pleuritic chest pain can take some time to heal and we can potentially add some pain medication but I want to make sure her pulse rate is not staying fast.    We may have to screen for blood clot   When was she last on prednisone? Steroid?

## 2018-05-30 NOTE — Telephone Encounter (Signed)
Patient notified and she scheduled appointment for tomorrow with Vincenza Hews.   Patient was only doing neb treatment 2 times a day and I notified her to 3 times a day.  She states that her heart rate was back to normal.

## 2018-05-31 ENCOUNTER — Ambulatory Visit: Payer: Managed Care, Other (non HMO) | Admitting: Medical

## 2018-06-02 ENCOUNTER — Ambulatory Visit: Payer: Managed Care, Other (non HMO) | Admitting: Medical

## 2018-06-27 ENCOUNTER — Encounter: Payer: Self-pay | Admitting: Medical

## 2018-09-27 ENCOUNTER — Telehealth: Payer: Self-pay | Admitting: Medical

## 2018-09-27 NOTE — Telephone Encounter (Signed)
Pt called and stated that she went to an urgent care today for her asthma. She was prescribed prednisone and Azithromycin. She knows the prednisone is fine but wanted to know if she should take the Azithromycin?

## 2018-09-28 NOTE — Telephone Encounter (Signed)
If they felt she had a bacterial infection or early pneumonia or sinus infection, then yes the antibiotic would be appropriately.   Obviously I didn't see her or examine her to know for sure.

## 2018-09-29 NOTE — Telephone Encounter (Signed)
Patient's mom notified.

## 2018-10-05 ENCOUNTER — Other Ambulatory Visit: Payer: Self-pay

## 2018-10-05 DIAGNOSIS — J4531 Mild persistent asthma with (acute) exacerbation: Secondary | ICD-10-CM

## 2018-10-05 MED ORDER — ALBUTEROL SULFATE HFA 108 (90 BASE) MCG/ACT IN AERS: INHALATION_SPRAY | RESPIRATORY_TRACT | 1 refills | Status: DC

## 2018-10-05 MED ORDER — ALBUTEROL SULFATE (2.5 MG/3ML) 0.083% IN NEBU: 2.5000 mg | INHALATION_SOLUTION | Freq: Four times a day (QID) | RESPIRATORY_TRACT | 2 refills | Status: DC | PRN

## 2018-10-05 NOTE — Telephone Encounter (Signed)
Patient called and has requested a refill on 2 of the following medications. She would like a call back once they have been sent.

## 2018-10-05 NOTE — Telephone Encounter (Signed)
Is this ok to refill?  

## 2018-11-03 ENCOUNTER — Other Ambulatory Visit: Payer: Self-pay

## 2018-11-03 ENCOUNTER — Telehealth (INDEPENDENT_AMBULATORY_CARE_PROVIDER_SITE_OTHER): Payer: Managed Care, Other (non HMO) | Admitting: Medical

## 2018-11-03 ENCOUNTER — Other Ambulatory Visit: Payer: Self-pay | Admitting: Medical

## 2018-11-03 ENCOUNTER — Ambulatory Visit: Payer: Managed Care, Other (non HMO) | Admitting: Medical

## 2018-11-03 DIAGNOSIS — R0602 Shortness of breath: Secondary | ICD-10-CM | POA: Diagnosis not present

## 2018-11-03 DIAGNOSIS — J988 Other specified respiratory disorders: Secondary | ICD-10-CM

## 2018-11-03 DIAGNOSIS — R062 Wheezing: Secondary | ICD-10-CM | POA: Diagnosis not present

## 2018-11-03 DIAGNOSIS — J45998 Other asthma: Secondary | ICD-10-CM

## 2018-11-03 MED ORDER — FLUTICASONE FUROATE-VILANTEROL 200-25 MCG/INH IN AEPB: 1.0000 | INHALATION_SPRAY | Freq: Every day | RESPIRATORY_TRACT | 11 refills | Status: DC

## 2018-11-03 NOTE — Telephone Encounter (Signed)
Pt called & states she had flu 3 weeks ago, now has sore throat, runny nose, cough and wheezing, still on doxycyline & prednisone.  Body aches better, no fever, but concern now for corona because still really sick.  Wants to know if she should be tested or recommendations?

## 2018-11-03 NOTE — Telephone Encounter (Signed)
Documentation for Telephone encounter:  This telephone service is not related to other E/M service within previous 7 days.  Patient consented to the consult.  This telephone consult involved patient, her mother on conference call, and myself, Catering manager PA-C.  Subjective: Patient and mom called in on conference call.  She had influenza 3 weeks ago and was treated with Tamiflu and got better with this.  Over the last several days she has had cough, sore throat, runny nose, wheezing, nasal drip, ended up going to urgent care was given 5-day course of prednisone and doxycycline.  That was not helping.  She denies fever, no recent travel, no sick contacts.  She says she is out of her Breo and Proventil inhaler but is using albuterol nebulizer.  She was wondering whether she should be tested for coronavirus   Objective: Not dyspneic on the phone   Assessment: Asthma SOB Wheezing Respiratory tract infection   Plan: Advise she continue albuterol by nebulizer 3 times a day, continue or finish the doxycycline and prednisone given by urgent care, restart Breo inhaler which she had been out of apparently, rest, self quarantine for the next 4 to 5 days until symptom-free for at least 72 hours.  She does not meet criteria necessarily for coronavirus testing.  I advised that she is much worse over the weekend to go to the emergency department  I sent refills on Breo inhaler   Time involving medical discussion was 5 minutes.

## 2019-02-19 ENCOUNTER — Telehealth: Payer: Self-pay | Admitting: Medical

## 2019-02-19 ENCOUNTER — Other Ambulatory Visit: Payer: Self-pay | Admitting: Medical

## 2019-02-19 ENCOUNTER — Telehealth: Payer: Self-pay | Admitting: Internal Medicine

## 2019-02-19 DIAGNOSIS — J4531 Mild persistent asthma with (acute) exacerbation: Secondary | ICD-10-CM

## 2019-02-19 MED ORDER — ALBUTEROL SULFATE HFA 108 (90 BASE) MCG/ACT IN AERS: INHALATION_SPRAY | RESPIRATORY_TRACT | 0 refills | Status: DC

## 2019-02-19 NOTE — Telephone Encounter (Signed)
[facility]

## 2019-02-19 NOTE — Telephone Encounter (Signed)
I sent this already today 

## 2019-02-19 NOTE — Telephone Encounter (Signed)
  [facility]  Albuterol HFA (Proair) inhaler

## 2019-07-26 ENCOUNTER — Telehealth: Payer: Self-pay | Admitting: Medical

## 2019-07-26 NOTE — Telephone Encounter (Signed)
I was going to fill these medications for the pt. But noticed she has not been seen since 05/23/18 she wanted a refill on albuterol inhaler, and [facility]bulizer solution [facility] [place] [address]. Lorina Rabon.

## 2019-07-26 NOTE — Telephone Encounter (Signed)
Pt called and is requesting refills on her albuterol inhaler and nebulizer solution [id number] [place], [place] pt states she is out of RX

## 2019-07-27 NOTE — Telephone Encounter (Signed)
Called patient about scheduling an appointment. Unable to leave message on machine due to VM being full.

## 2019-07-27 NOTE — Telephone Encounter (Signed)
If last visit over 1 year ago then yes she is due for a appointment or physical so please schedule

## 2019-11-03 ENCOUNTER — Other Ambulatory Visit: Payer: Self-pay | Admitting: Medical

## 2019-11-06 NOTE — Telephone Encounter (Signed)
Last visit > 1 year.  Needs physical/ follow up appt.  Fasting.

## 2019-11-07 ENCOUNTER — Encounter: Payer: Self-pay | Admitting: Medical

## 2019-11-07 NOTE — Telephone Encounter (Signed)
Tried to call pt and she did not answer. Voice mail was full so unable to leave a message. Will send pt a letter requesting she call the office to make an appt.

## 2019-12-21 IMAGING — DX DG CHEST 2V
2 series · 2 of 2 positions shown · non-contrast
Comparison: None.

CLINICAL DATA: Pain beneath the right breast for 3 days with
shortness of breath and wheezing.

EXAM:
CHEST - 2 VIEW

[dg chest 2 view (1 of 2)]
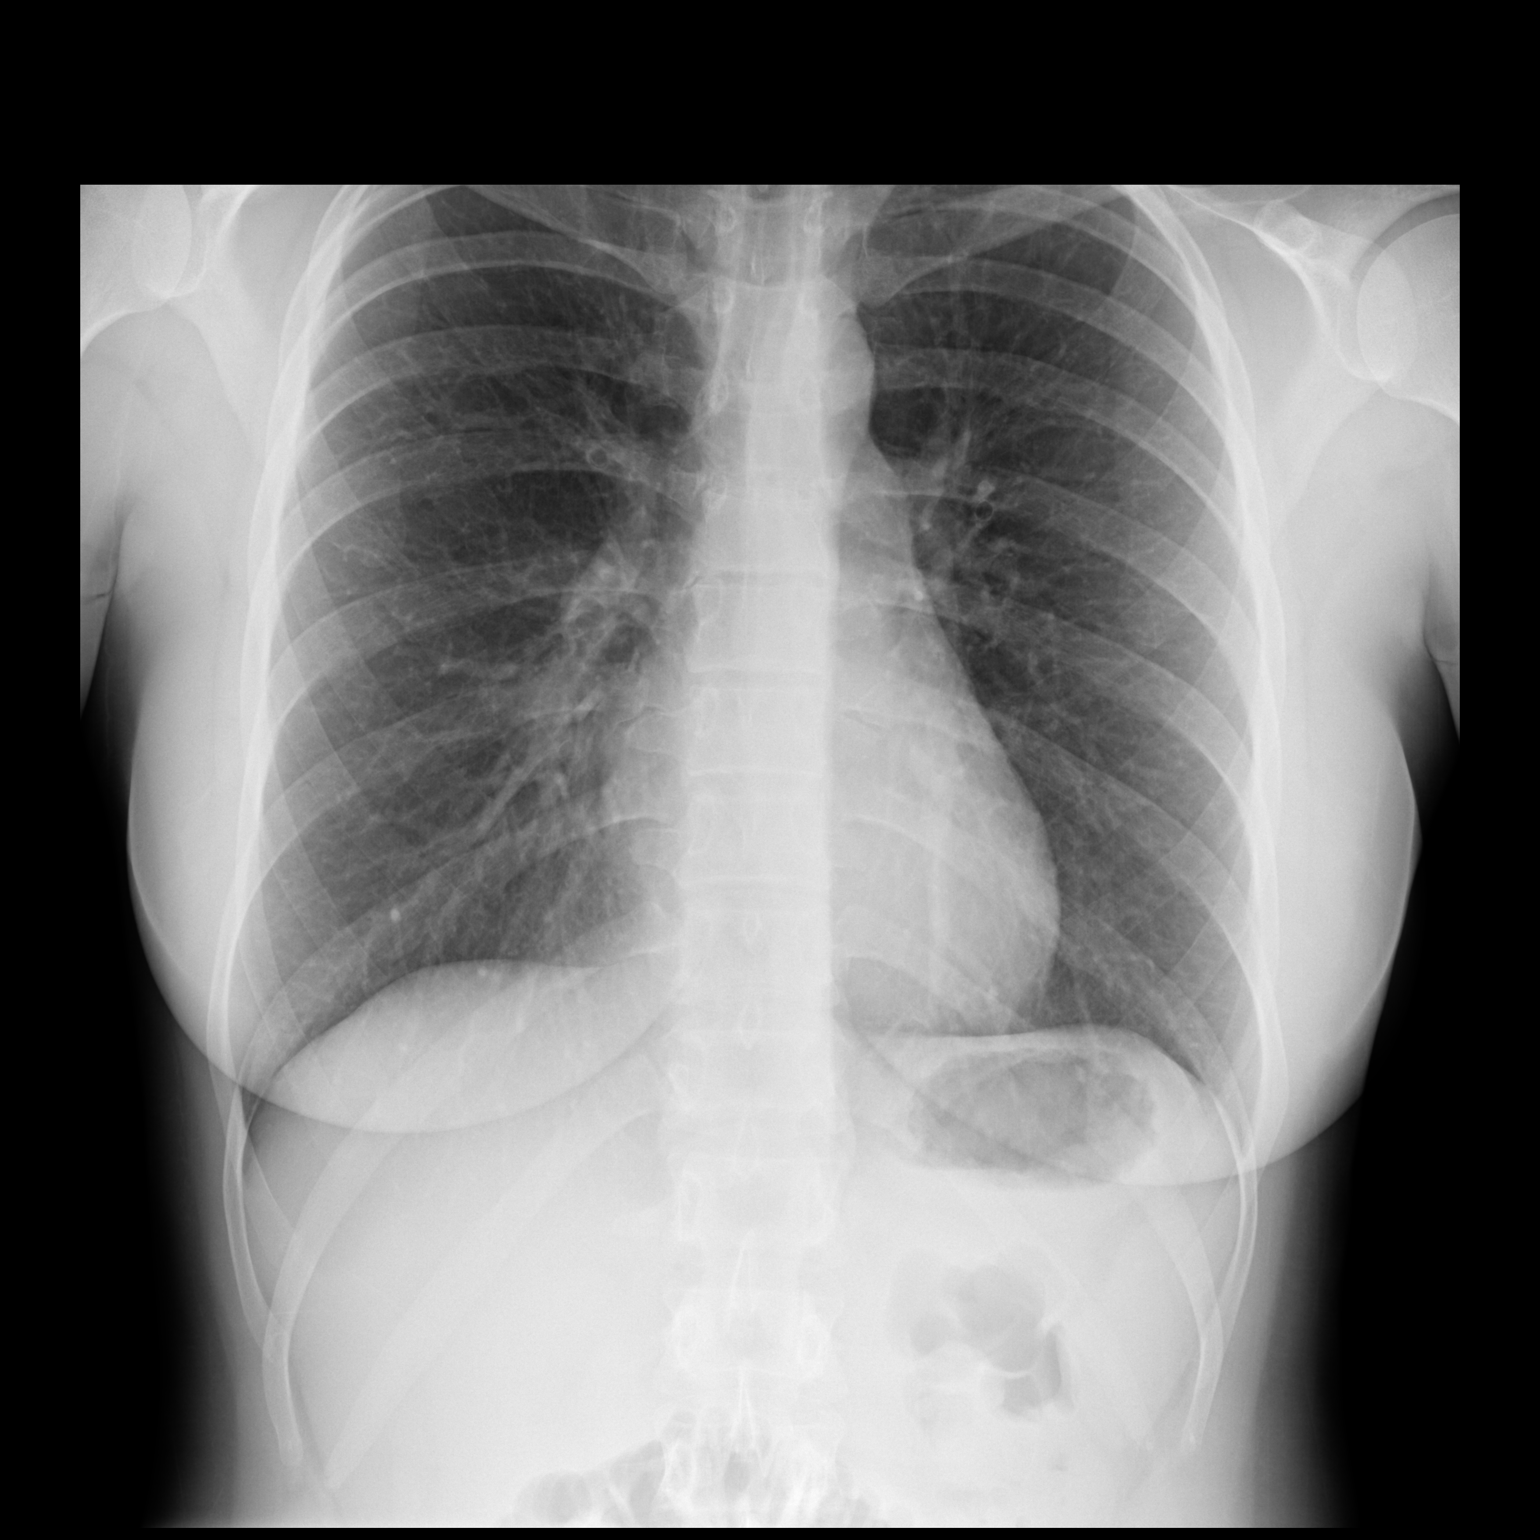

[dg chest 2 view (2 of 2)]
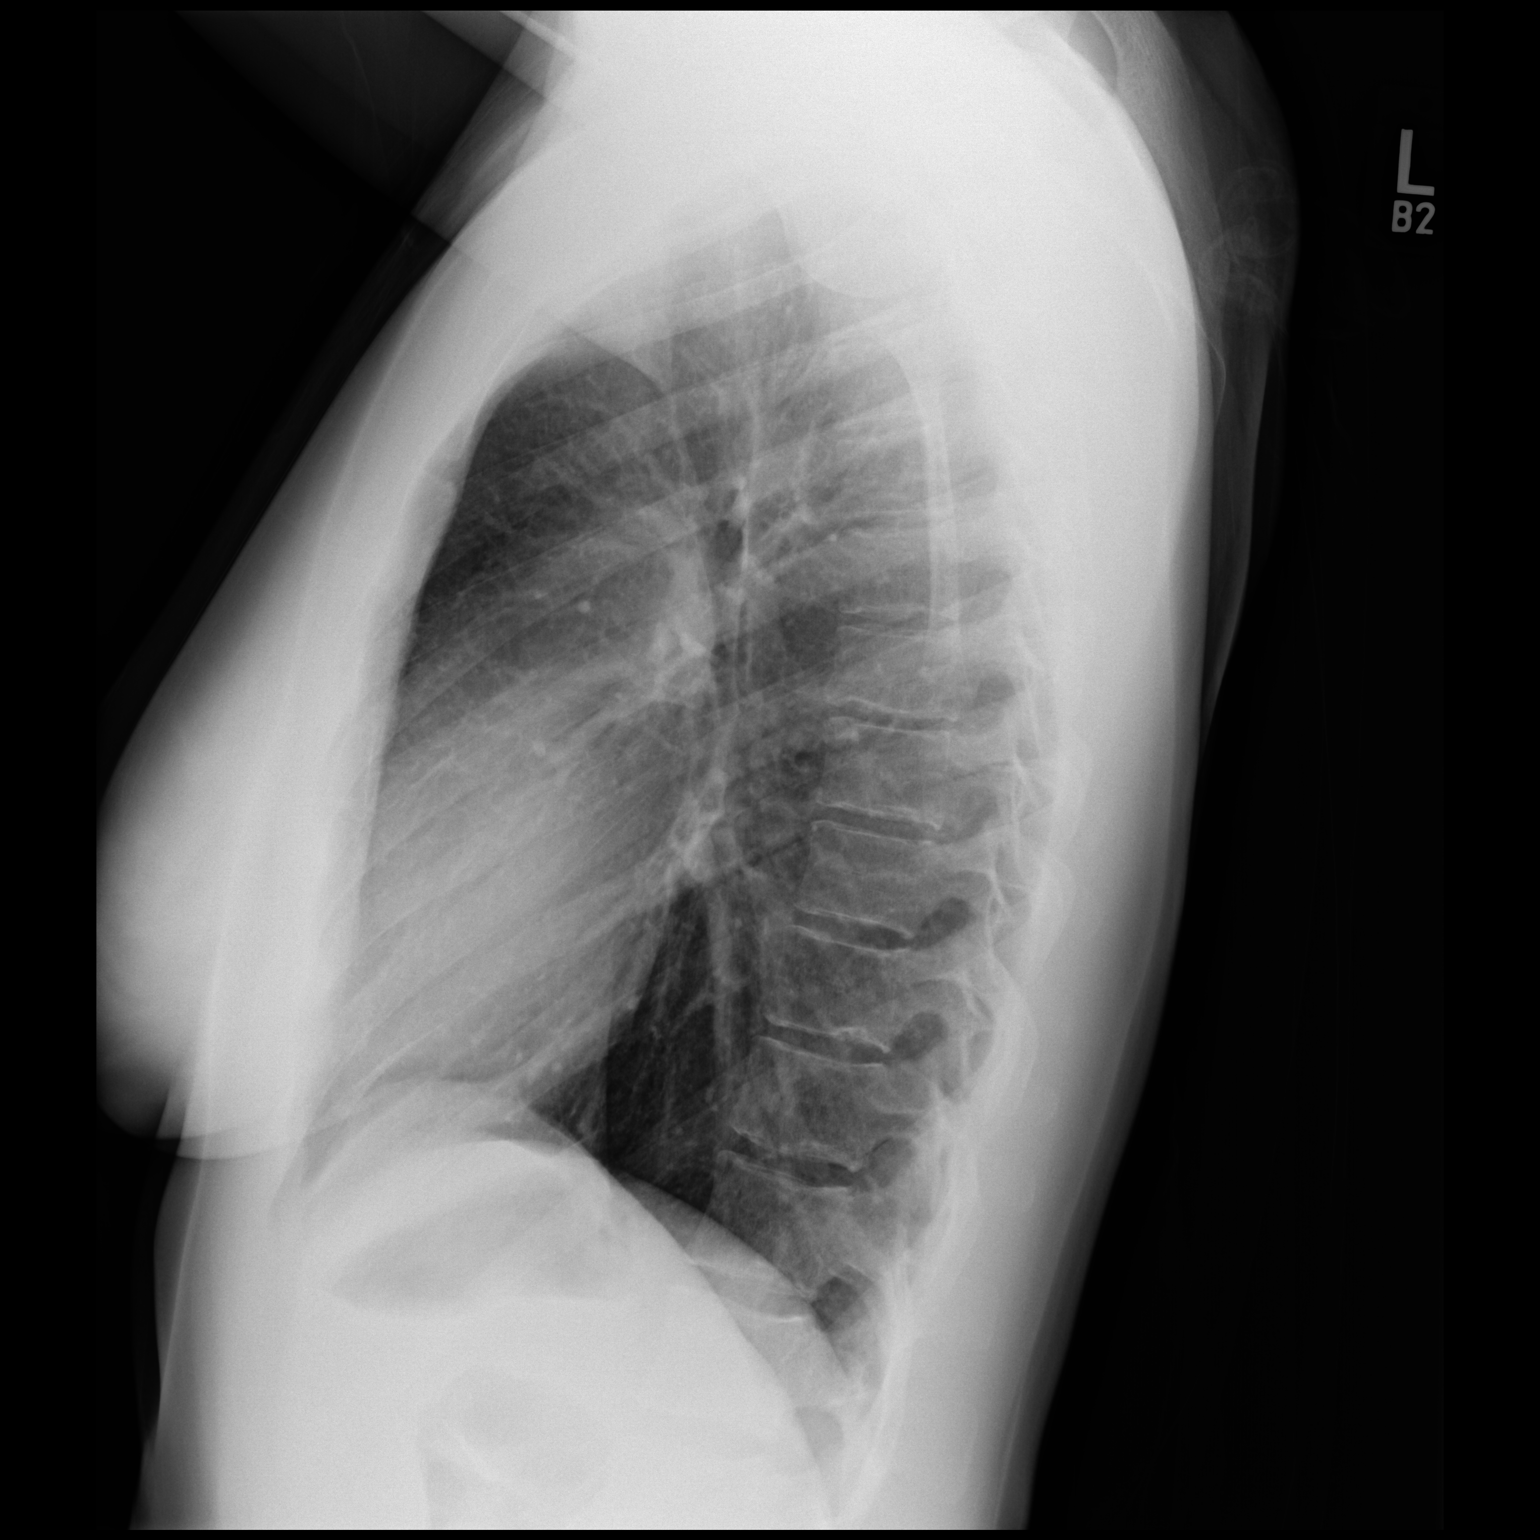

[2 of 2 positions shown; findings below may reference images not displayed]

FINDINGS: The lungs are clear. Heart size is normal. There is no pneumothorax
or pleural fluid. No acute or focal bony abnormality.
IMPRESSION: Negative chest.

## 2021-10-06 ENCOUNTER — Encounter: Payer: Self-pay | Admitting: Medical

## 2021-10-06 ENCOUNTER — Telehealth: Payer: 59 | Admitting: Medical

## 2021-10-06 ENCOUNTER — Other Ambulatory Visit: Payer: Self-pay

## 2021-10-06 VITALS — Ht 70.0 in | Wt 195.0 lb

## 2021-10-06 DIAGNOSIS — J301 Allergic rhinitis due to pollen: Secondary | ICD-10-CM

## 2021-10-06 DIAGNOSIS — J4531 Mild persistent asthma with (acute) exacerbation: Secondary | ICD-10-CM | POA: Diagnosis not present

## 2021-10-06 MED ORDER — ALBUTEROL SULFATE HFA 108 (90 BASE) MCG/ACT IN AERS: INHALATION_SPRAY | RESPIRATORY_TRACT | 2 refills | Status: DC

## 2021-10-06 MED ORDER — ALBUTEROL SULFATE (2.5 MG/3ML) 0.083% IN NEBU: 2.5000 mg | INHALATION_SOLUTION | Freq: Four times a day (QID) | RESPIRATORY_TRACT | 2 refills | Status: DC | PRN

## 2021-10-06 MED ORDER — FLUTICASONE FUROATE-VILANTEROL 200-25 MCG/ACT IN AEPB: 1.0000 | INHALATION_SPRAY | Freq: Every day | RESPIRATORY_TRACT | 3 refills | Status: DC

## 2021-10-06 NOTE — Progress Notes (Signed)
Subjective:     Patient ID: Sarah Davidson, female   DOB: 1995-11-10, 26 y.o.   MRN: [id number]  This visit type was conducted due to national recommendations for restrictions regarding the COVID-19 Pandemic (e.g. social distancing) in an effort to limit this patient's exposure and mitigate transmission in our community.  Due to their co-morbid illnesses, this patient is at least at moderate risk for complications without adequate follow up.  This format is felt to be most appropriate for this patient at this time.    Documentation for virtual audio and video telecommunications through [facility] encounter:  The patient was located at home. The provider was located in the office. The patient did consent to this visit and is aware of possible charges through their insurance for this visit.  The other persons participating in this telemedicine service were none. Time spent on call was 20 minutes and in review of previous records 20 minutes total.  This virtual service is not related to other E/M service within previous 7 days.   HPI Chief Complaint  Patient presents with   asthma flare    Asthma flare, right sided of chest and tightness x 2 days ago. Wheezing started last night.    Virtual consult for asthma.   Hasn't been here in a few years and hasn't been sick or had any asthma flare in last 2 years.    2 days ago started getting some back tightness.  Last year was having pains in ribs, saw ortho, had xrays.   Was given medicaiton for muscle spasms that helped.    Now has similar symptoms compared to last year, but this time having asthma symptoms.     Took hot shower this morning to help the back.  Minimal cough that is dry cough.  Only with deep breath does she get a cough.    Has no sneezing, but had some nasal congestion and mucous last night.  No fever, no sore throat.  No ear pain.  No NVD.  No itchy and watery eyes.    No need for asthma maintenance medications in over 2  years.  Living in [place], [place] currently.    Past Medical History:  Diagnosis Date   ADHD (attention deficit hyperactivity disorder) 2014   Anxiety    Asthma     No current outpatient medications on file prior to visit.   No current facility-administered medications on file prior to visit.    Review of Systems As in subjective    Objective:   Physical Exam Due to coronavirus pandemic stay at home measures, patient visit was virtual and they were not examined in person.   Ht 5\' 10"  (1.778 m)    Wt 195 lb (88.5 kg)    BMI 27.98 kg/m   Gen: wd, wn, nad No obvious wheezing or labored breathing Well appearing     Assessment:     Encounter Diagnoses  Name Primary?   Mild persistent asthma with exacerbation    Allergic rhinitis due to pollen, unspecified seasonality Yes       Plan:     Currently having an asthma flare due to the spring pollen triggers.  She has done well with the last 2 years without asthma flare at all.  She has started allergy pill daily and I advise she continue this for the next 1 to 2 months.  Get back on albuterol for current symptoms.  If having to use albuterol at least 2 more times daily  for the next 4 days and morning get back on Breo.  Otherwise she may use just allergy pill daily, avoidance of triggers, and albuterol as needed.  She still has her neb machine.  She will likely need to use this regimen of allergy medication daily and as needed albuterol for the next 2 months during spring allergy season.  If not improving within the next week or if worsening then call back or recheck.  Certainly she can add Breo if symptoms warrant as we discussed for maintenance.  Ileta was seen today for asthma flare.  Diagnoses and all orders for this visit:  Allergic rhinitis due to pollen, unspecified seasonality  Mild persistent asthma with exacerbation Comments: discussed potential triggers, including allergies (based on exam); treat with prednisone,  f/u in 2 weeks for spirometry Orders: -     albuterol (PROAIR HFA) 108 (90 Base) MCG/ACT inhaler; TAKE 2 PUFFS BY MOUTH EVERY 6 HOURS AS NEEDED FOR WHEEZE OR SHORTNESS OF BREATH -     albuterol (PROVENTIL) (2.5 MG/3ML) 0.083% nebulizer solution; Take 3 mLs (2.5 mg total) by nebulization every 6 (six) hours as needed for wheezing or shortness of breath.  Other orders -     fluticasone furoate-vilanterol (BREO ELLIPTA) 200-25 MCG/ACT AEPB; Inhale 1 puff into the lungs daily.  F/u prn

## 2022-04-21 ENCOUNTER — Encounter: Payer: Self-pay | Admitting: Internal Medicine

## 2022-04-23 ENCOUNTER — Ambulatory Visit: Payer: 59 | Admitting: Medical

## 2022-04-23 VITALS — BP 110/70 | HR 89 | Wt 230.0 lb

## 2022-04-23 DIAGNOSIS — G4700 Insomnia, unspecified: Secondary | ICD-10-CM | POA: Diagnosis not present

## 2022-04-23 DIAGNOSIS — R002 Palpitations: Secondary | ICD-10-CM

## 2022-04-23 DIAGNOSIS — G43809 Other migraine, not intractable, without status migrainosus: Secondary | ICD-10-CM

## 2022-04-23 NOTE — Progress Notes (Signed)
Subjective: Chief Complaint  Patient presents with   Palpitations    Heart palpitations- lasting hours at work last week but nothing has happen this week that she has felt.    Here for palpitations.  Last week one evening went to sit down, started feeling lightheaded.  This started to give her some anxiety.  Started feeling pain between shoulder blades.   Felt arms tingling.  Was getting worried, started talking to a nurse friend.   Got anxious, nauseated, and vomited.  Reflecting back thought it was from drinking too much caffeine, being tired, working too hard.  Then next day was having some additional palpitations.     Has cut back caffeine since palpations.   Drinks caffeine while at work ,3 nights per week at work.   4-5 espressos when at work, and sometimes energy drinks.  Not sleeping well.  Has 2 dogs that wake her up.   working 3 days per week, 13-14 hour days.  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   Now started to get migraines again.  No other aggravating or relieving factors. No other complaint.   Past Medical History:  Diagnosis Date   ADHD (attention deficit hyperactivity disorder) 2014   Anxiety    Asthma    Current Outpatient Medications on File Prior to Visit  Medication Sig Dispense Refill   albuterol (PROAIR HFA) 108 (90 Base) MCG/ACT inhaler TAKE 2 PUFFS BY MOUTH EVERY 6 HOURS AS NEEDED FOR WHEEZE OR SHORTNESS OF BREATH 18 g 2   albuterol (PROVENTIL) (2.5 MG/3ML) 0.083% nebulizer solution Take 3 mLs (2.5 mg total) by nebulization every 6 (six) hours as needed for wheezing or shortness of breath. 75 mL 2   fluticasone furoate-vilanterol (BREO ELLIPTA) 200-25 MCG/ACT AEPB Inhale 1 puff into the lungs daily. 60 each 3   No current facility-administered medications on file prior to visit.   ROS as in  subjective   Objective: BP 110/70   Pulse 89   Wt 230 lb (104.3 kg)   SpO2 98%   BMI 33.00 kg/m   General appearence: alert, no distress, WD/WN,  HEENT: normocephalic, sclerae anicteric, TMs pearly, nares patent, no discharge or erythema, pharynx normal Oral cavity: MMM, no lesions Neck: supple, no lymphadenopathy, no thyromegaly, no masses, no bruits Heart: RRR, normal S1, S2, no murmurs Lungs: CTA bilaterally, no wheezes, rhonchi, or rales Pulses: 2+ symmetric, upper and lower extremities, normal cap refill Neuor: cn2-12 intact, nonfocal exam Psych: pleasant, good eye contact, answers questions appropriately  EKG reviewed   Assessment: Encounter Diagnoses  Name Primary?   Palpitation Yes   Other migraine without status migrainosus, not intractable    Insomnia, unspecified type      Plan: We discussed her symptoms and concerns.  Advise she significantly cut back on caffeine which she has started doing, increase water intake.  Get rest when possible.  Work on getting some good sleep hygiene.  She will use some Benadryl or melatonin for now.  She declines any prescription sleep aid.  Labs today for further evaluation.  EKG unremarkable.  If headaches persist after the next few weeks we may need to recheck.  Consider other preventative medicine for migraines if they persist.  Consider sleep study given the headaches and sleep issues.  Sarah Davidson was seen today for palpitations.  Diagnoses  and all orders for this visit:  Palpitation -     EKG 12-Lead -     Basic metabolic panel -     TSH -     CBC with Differential/Platelet  Other migraine without status migrainosus, not intractable  Insomnia, unspecified type    F/u pending labs

## 2022-04-24 LAB — BASIC METABOLIC PANEL
BUN/Creatinine Ratio: 21 (ref 9–23)
BUN: 16 mg/dL (ref 6–20)
CO2: 22 mmol/L (ref 20–29)
Calcium: 9.4 mg/dL (ref 8.7–10.2)
Chloride: 104 mmol/L (ref 96–106)
Creatinine, Ser: 0.77 mg/dL (ref 0.57–1.00)
Glucose: 94 mg/dL (ref 70–99)
Potassium: 4.7 mmol/L (ref 3.5–5.2)
Sodium: 140 mmol/L (ref 134–144)
eGFR: 108 mL/min/{1.73_m2} (ref >59)

## 2022-04-24 LAB — CBC WITH DIFFERENTIAL/PLATELET
Basophils Absolute: 0.1 10*3/uL (ref 0.0–0.2)
Basos: 1 %
EOS (ABSOLUTE): 0.3 10*3/uL (ref 0.0–0.4)
Eos: 3 %
Hematocrit: 39.5 % (ref 34.0–46.6)
Hemoglobin: 13.6 g/dL (ref 11.1–15.9)
Immature Grans (Abs): 0 10*3/uL (ref 0.0–0.1)
Immature Granulocytes: 0 %
Lymphocytes Absolute: 3.3 10*3/uL — ABNORMAL HIGH (ref 0.7–3.1)
Lymphs: 40 %
MCH: 30.4 pg (ref 26.6–33.0)
MCHC: 34.4 g/dL (ref 31.5–35.7)
MCV: 88 fL (ref 79–97)
Monocytes Absolute: 0.6 10*3/uL (ref 0.1–0.9)
Monocytes: 7 %
Neutrophils Absolute: 3.9 10*3/uL (ref 1.4–7.0)
Neutrophils: 49 %
Platelets: 288 10*3/uL (ref 150–450)
RBC: 4.47 x10E6/uL (ref 3.77–5.28)
RDW: 12.6 % (ref 11.7–15.4)
WBC: 8.1 10*3/uL (ref 3.4–10.8)

## 2022-04-24 LAB — TSH: TSH: 0.526 u[IU]/mL (ref 0.450–4.500)

## 2022-05-25 ENCOUNTER — Encounter: Payer: Self-pay | Admitting: Internal Medicine

## 2022-08-03 ENCOUNTER — Ambulatory Visit (INDEPENDENT_AMBULATORY_CARE_PROVIDER_SITE_OTHER): Payer: 59 | Admitting: Nurse Practitioner

## 2022-08-03 ENCOUNTER — Other Ambulatory Visit (HOSPITAL_COMMUNITY)
Admission: RE | Admit: 2022-08-03 | Discharge: 2022-08-03 | Disposition: A | Discharge status: Home / Self Care | Payer: 59 | Source: Ambulatory Visit | Attending: Nurse Practitioner | Admitting: Nurse Practitioner

## 2022-08-03 ENCOUNTER — Encounter: Payer: Self-pay | Admitting: Nurse Practitioner

## 2022-08-03 VITALS — BP 118/82 | HR 105 | Ht 69.5 in | Wt 232.0 lb

## 2022-08-03 DIAGNOSIS — Z01419 Encounter for gynecological examination (general) (routine) without abnormal findings: Secondary | ICD-10-CM

## 2022-08-03 DIAGNOSIS — Z113 Encounter for screening for infections with a predominantly sexual mode of transmission: Secondary | ICD-10-CM

## 2022-08-03 DIAGNOSIS — Z23 Encounter for immunization: Secondary | ICD-10-CM

## 2022-08-03 NOTE — Progress Notes (Signed)
   Riley Churches NICLE CONNOLE 1996-08-03 [id number]   History:  26 y.o. G1P0010 presents as new patient to establish care. No GYN complaints. Monthly cycles. Has not had pap smear. H/O asthma, ADHD. Had 2 doses of Gardasil in 2020.   Gynecologic History Patient's last menstrual period was 07/14/2022 (approximate). Period Cycle (Days):  (28-30) Period Duration (Days): 5 Menstrual Flow: Moderate Menstrual Control: Tampon, Panty liner Dysmenorrhea: (!) Mild Dysmenorrhea Symptoms: Cramping Contraception/Family planning: none Sexually active: Yes  Health Maintenance Last Pap: Never Last mammogram: Not indicated Last colonoscopy: Not indicated Last Dexa: Not indicated  Past medical history, past surgical history, family history and social history were all reviewed and documented in the EPIC chart. 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   ROS:  A ROS was performed and pertinent positives and negatives are included.  Exam:  Vitals:   08/03/22 1351  BP: 118/82  Pulse: (!) 105  SpO2: 98%  Weight: 232 lb (105.2 kg)  Height: 5' 9.5" (1.765 m)   Body mass index is 33.77 kg/m.  General appearance:  Normal Thyroid:  Symmetrical, normal in size, without palpable masses or nodularity. Respiratory  Auscultation:  Clear without wheezing or rhonchi Cardiovascular  Auscultation:  Regular rate, without rubs, murmurs or gallops  Edema/varicosities:  Not grossly evident Abdominal  Soft,nontender, without masses, guarding or rebound.  Liver/spleen:  No organomegaly noted  Hernia:  None appreciated  Skin  Inspection:  Grossly normal Breasts: Examined lying and sitting.   Right: Without masses, retractions, nipple discharge or axillary adenopathy.   Left: Without masses, retractions, nipple discharge or axillary adenopathy. Genitourinary   Inguinal/mons:  Normal without inguinal adenopathy  External genitalia:  Normal appearing vulva with no masses, tenderness, or lesions  BUS/Urethra/Skene's glands:   Normal  Vagina:  Normal appearing with normal color and discharge, no lesions  Cervix:  Normal appearing without discharge or lesions  Uterus:  Normal in size, shape and contour.  Midline and mobile, nontender  Adnexa/parametria:     Rt: Normal in size, without masses or tenderness.   Lt: Normal in size, without masses or tenderness.  Anus and perineum: Normal  Digital rectal exam: Not indicated  Patient informed chaperone available to be present for breast and pelvic exam. Patient has requested no chaperone to be present. Patient has been advised what will be completed during breast and pelvic exam.   Assessment/Plan:  26 y.o. G1P0010 to establish care.   Well female exam with routine gynecological exam - Plan: Cytology - PAP( [facility]). Education provided on SBEs, importance of preventative screenings, current guidelines, high calcium diet, regular exercise, and multivitamin daily. Labs with PCP.   Screening examination for STD (sexually transmitted disease) - Plan: Cytology - PAP( [facility]). GC/CT/trich added to pap. Declines HIV/RPR.   Need for HPV vaccine - Plan: HPV 9-valent vaccine,Recombinat. 3rd dose provided today.   Screening for cervical cancer - First pap today.    Return in 1 year for annual.     Olivia Mackie DNP, 2:28 PM 08/03/2022

## 2022-08-05 LAB — CYTOLOGY - PAP
Chlamydia: NEGATIVE
Comment: NEGATIVE
Comment: NEGATIVE
Comment: NORMAL
Diagnosis: NEGATIVE
Neisseria Gonorrhea: NEGATIVE
Trichomonas: NEGATIVE

## 2022-08-12 ENCOUNTER — Telehealth: Payer: 59

## 2022-08-12 ENCOUNTER — Ambulatory Visit: Admit: 2022-08-12 | Discharge status: Against Medical Advice | Payer: 59

## 2023-04-20 ENCOUNTER — Ambulatory Visit: Payer: 59 | Admitting: Nurse Practitioner

## 2023-04-20 ENCOUNTER — Ambulatory Visit: Payer: 59 | Admitting: Medical

## 2023-06-01 DIAGNOSIS — S43402A Unspecified sprain of left shoulder joint, initial encounter: Secondary | ICD-10-CM | POA: Diagnosis not present

## 2023-06-08 DIAGNOSIS — M25512 Pain in left shoulder: Secondary | ICD-10-CM | POA: Diagnosis not present

## 2023-10-07 ENCOUNTER — Encounter: Payer: Self-pay | Admitting: Advanced Practice Midwife

## 2023-10-13 DIAGNOSIS — Z3201 Encounter for pregnancy test, result positive: Secondary | ICD-10-CM | POA: Diagnosis not present

## 2023-11-07 DIAGNOSIS — Z3689 Encounter for other specified antenatal screening: Secondary | ICD-10-CM | POA: Diagnosis not present

## 2023-11-07 DIAGNOSIS — Z3401 Encounter for supervision of normal first pregnancy, first trimester: Secondary | ICD-10-CM | POA: Diagnosis not present

## 2023-11-07 DIAGNOSIS — O26891 Other specified pregnancy related conditions, first trimester: Secondary | ICD-10-CM | POA: Diagnosis not present

## 2023-11-07 DIAGNOSIS — E041 Nontoxic single thyroid nodule: Secondary | ICD-10-CM | POA: Diagnosis not present

## 2023-11-07 DIAGNOSIS — Z113 Encounter for screening for infections with a predominantly sexual mode of transmission: Secondary | ICD-10-CM | POA: Diagnosis not present

## 2023-11-07 DIAGNOSIS — Z3A09 9 weeks gestation of pregnancy: Secondary | ICD-10-CM | POA: Diagnosis not present

## 2023-11-07 DIAGNOSIS — Z363 Encounter for antenatal screening for malformations: Secondary | ICD-10-CM | POA: Diagnosis not present

## 2023-11-07 LAB — OB RESULTS CONSOLE RUBELLA ANTIBODY, IGM: Rubella: IMMUNE

## 2023-11-07 LAB — OB RESULTS CONSOLE GC/CHLAMYDIA
Chlamydia: NEGATIVE
Neisseria Gonorrhea: NEGATIVE

## 2023-11-07 LAB — OB RESULTS CONSOLE HIV ANTIBODY (ROUTINE TESTING): HIV: NONREACTIVE

## 2023-11-07 LAB — OB RESULTS CONSOLE HEPATITIS B SURFACE ANTIGEN: Hepatitis B Surface Ag: NEGATIVE

## 2023-11-07 LAB — HEPATITIS C ANTIBODY: HCV Ab: NEGATIVE

## 2023-11-07 LAB — OB RESULTS CONSOLE ANTIBODY SCREEN: Antibody Screen: NEGATIVE

## 2023-11-07 LAB — TSH: TSH: 0.56 (ref 0.41–5.90)

## 2023-11-07 LAB — OB RESULTS CONSOLE RPR: RPR: NONREACTIVE

## 2023-11-10 ENCOUNTER — Telehealth: Payer: Self-pay | Admitting: *Deleted

## 2023-11-10 NOTE — Telephone Encounter (Signed)
 Called patient and scheduled her with Vincenza Hews for Monday afternoon.

## 2023-11-14 ENCOUNTER — Ambulatory Visit: Admitting: Medical

## 2023-11-14 VITALS — BP 120/80 | HR 87 | Wt 256.4 lb

## 2023-11-14 DIAGNOSIS — E041 Nontoxic single thyroid nodule: Secondary | ICD-10-CM

## 2023-11-14 DIAGNOSIS — Z3A11 11 weeks gestation of pregnancy: Secondary | ICD-10-CM

## 2023-11-14 NOTE — Progress Notes (Signed)
 Subjective:  Sarah Davidson is a 28 y.o. female who presents for Chief Complaint  Patient presents with   Thyroid Problem    Thyroid nodule, TSH 0.559, T4 was normal 1.00 done at [facility] but since being pregnant-11 weeks she needs a thyroid Ultrasound     Here for thyroid nodule.  She notes nodule has been present probably over a year unchanged.  She is seeing OB/gyn, is pregnant x 11 weeks.  Gyn asked her to come in for eval of nodule.  Otherwise feeling ok, no other issues.  No family history of thyroid disease.    No other aggravating or relieving factors.    No other c/o.  Past Medical History:  Diagnosis Date   ADHD (attention deficit hyperactivity disorder) 2014   Anxiety    Asthma    Current Outpatient Medications on File Prior to Visit  Medication Sig Dispense Refill   albuterol (PROAIR HFA) 108 (90 Base) MCG/ACT inhaler TAKE 2 PUFFS BY MOUTH EVERY 6 HOURS AS NEEDED FOR WHEEZE OR SHORTNESS OF BREATH 18 g 2   No current facility-administered medications on file prior to visit.    The following portions of the patient's history were reviewed and updated as appropriate: allergies, current medications, past family history, past medical history, past social history, past surgical history and problem list.  ROS Otherwise as in subjective above    Objective: BP 120/80   Pulse 87   Wt 256 lb 6.4 oz (116.3 kg)   BMI 37.32 kg/m   General appearance: alert, no distress, well developed, well nourished Neck: supple, no lymphadenopathy, right sided nodule approx 1 cm diameter, no thyromegaly, no masses     Assessment: Encounter Diagnoses  Name Primary?   Thyroid nodule Yes   [redacted] weeks gestation of pregnancy      Plan: Discussed findings.  Baseline ultrasound ordered.  Additional labs today.    Follow up with ob/gyn for prenatal care   Lane was seen today for thyroid problem.  Diagnoses and all orders for this visit:  Thyroid nodule -     T3 -     T4, Free -      US THYROID; Future  [redacted] weeks gestation of pregnancy    Follow up: pending

## 2023-11-15 LAB — T4, FREE: Free T4: 0.94 ng/dL (ref 0.82–1.77)

## 2023-11-15 LAB — T3: T3, Total: 255 ng/dL — ABNORMAL HIGH (ref 71–180)

## 2023-11-15 NOTE — Progress Notes (Signed)
 Results sent through MyChart

## 2023-11-16 ENCOUNTER — Other Ambulatory Visit: Payer: Self-pay | Admitting: Medical

## 2023-11-16 DIAGNOSIS — R7989 Other specified abnormal findings of blood chemistry: Secondary | ICD-10-CM

## 2023-11-16 DIAGNOSIS — Z3A11 11 weeks gestation of pregnancy: Secondary | ICD-10-CM

## 2023-11-16 DIAGNOSIS — E041 Nontoxic single thyroid nodule: Secondary | ICD-10-CM

## 2023-11-24 ENCOUNTER — Ambulatory Visit
Admission: RE | Admit: 2023-11-24 | Discharge: 2023-11-24 | Disposition: A | Discharge status: Home / Self Care | Source: Ambulatory Visit | Attending: Medical | Admitting: Medical

## 2023-11-24 DIAGNOSIS — E041 Nontoxic single thyroid nodule: Secondary | ICD-10-CM | POA: Diagnosis not present

## 2023-11-29 ENCOUNTER — Encounter: Payer: Self-pay | Admitting: Internal Medicine

## 2023-12-04 ENCOUNTER — Other Ambulatory Visit: Payer: Self-pay | Admitting: Medical

## 2023-12-04 DIAGNOSIS — R7989 Other specified abnormal findings of blood chemistry: Secondary | ICD-10-CM

## 2023-12-06 ENCOUNTER — Telehealth: Payer: Self-pay | Admitting: Medical

## 2023-12-06 NOTE — Telephone Encounter (Signed)
 Copied from CRM [id number]. Topic: Referral - Status >> Dec 06, 2023 10:45 AM Chuck Crater wrote: Reason for CRM: Tamika with [facility] stated that they can't schedule patient until they have the results from her Thyroid  Ultrasound. They need to review it before scheduling the patient

## 2023-12-06 NOTE — Telephone Encounter (Signed)
 I have faxed over the Thyroid  Ultrasound results for pt to Tamika in [place]. Fax # [phone]

## 2023-12-20 ENCOUNTER — Other Ambulatory Visit: Payer: Self-pay | Admitting: Medical

## 2023-12-20 ENCOUNTER — Ambulatory Visit: Payer: Self-pay | Admitting: *Deleted

## 2023-12-20 DIAGNOSIS — J4531 Mild persistent asthma with (acute) exacerbation: Secondary | ICD-10-CM

## 2023-12-20 MED ORDER — ALBUTEROL SULFATE HFA 108 (90 BASE) MCG/ACT IN AERS: INHALATION_SPRAY | RESPIRATORY_TRACT | 2 refills | Status: AC

## 2023-12-20 NOTE — Telephone Encounter (Signed)
 Also sent you a CRM [id number] refill.

## 2023-12-20 NOTE — Telephone Encounter (Signed)
  Chief Complaint: cough, congestion- patient requesting inhaler Symptoms: patient states she is requesting inhaler to help with her breathing- she does not want to take any other medication at this point Frequency: symptoms started Thursday-12/15/23 Pertinent Negatives: Patient denies fever Disposition: [] ED /[] Urgent Care (no appt availability in office) / [] Appointment(In office/virtual)/ []  [facility]/ [] Home Care/ [x] Refused Recommended Disposition /[] [facility]/ []  Follow-up with PCP Additional Notes: Patient does not want an appointment at this time- she does have follow up scheduled in few weeks and will call back if she gets worse- she is early pregnant and does not want to take anything yet- just requesting albuterol  inhaler.    Copied from CRM [id number]. Topic: Clinical - Red Word Triage >> Dec 20, 2023  8:38 AM Tiffany B wrote: Red Word that prompted transfer to Nurse Triage: Patient is having difficulty breathing, wheezing, congestion and coughing, patient is [redacted] weeks pregnant. Reason for Disposition  [1] Longstanding difficulty breathing (e.g., CHF, COPD, emphysema) AND [2] WORSE than normal  Answer Assessment - Initial Assessment Questions 1. RESPIRATORY STATUS: "Describe your breathing?" (e.g., wheezing, shortness of breath, unable to speak, severe coughing)      SOB with exertion/coughing- patient pregnancy 2. ONSET: "When did this breathing problem begin?"      Coughing started Thursday night- Friday evening - cough, congestion 3. PATTERN "Does the difficult breathing come and go, or has it been constant since it started?"      Exertion, coughing spells 4. SEVERITY: "How bad is your breathing?" (e.g., mild, moderate, severe)    - MILD: No SOB at rest, mild SOB with walking, speaks normally in sentences, can lie down, no retractions, pulse < 100.    - MODERATE: SOB at rest, SOB with minimal exertion and prefers to sit, cannot lie down flat, speaks in  phrases, mild retractions, audible wheezing, pulse 100-120.    - SEVERE: Very SOB at rest, speaks in single words, struggling to breathe, sitting hunched forward, retractions, pulse > 120      mild 5. RECURRENT SYMPTOM: "Have you had difficulty breathing before?" If Yes, ask: "When was the last time?" and "What happened that time?"      Hx asthma- have not needed inhaler in years- only needs when sick  7. LUNG HISTORY: "Do you have any history of lung disease?"  (e.g., pulmonary embolus, asthma, emphysema)     Exercise induced/illness induced asthma 8. CAUSE: "What do you think is causing the breathing problem?"      Cough/congestion 9. OTHER SYMPTOMS: "Do you have any other symptoms? (e.g., dizziness, runny nose, cough, chest pain, fever)     Runny nose  11. PREGNANCY: "Is there any chance you are pregnant?" "When was your last menstrual period?"       Yes- 16 weeks  Protocols used: Breathing Difficulty-A-AH

## 2023-12-20 NOTE — Telephone Encounter (Signed)
 Copied from CRM [id number]. Topic: Clinical - Medication Refill >> Dec 20, 2023  8:36 AM Jyl Or B wrote: Most Recent Primary Care Visit:  Provider: Claudene Crystal  Department: [facility]  Visit Type: CONSULT 30  Date: 11/14/2023  Medication: albuterol  (PROAIR  HFA) 108 (90 Base) MCG/ACT inhaler  Has the patient contacted their pharmacy? No  (Agent: If yes, when and what did the pharmacy advise?) Contact PCP   Is this the correct pharmacy for this prescription? Yes  This is the patient's preferred pharmacy:  [id number] [place], [place] - [address] [place] [address] Phone: [phone] Fax: [phone]   Has the prescription been filled recently? No  Is the patient out of the medication? Yes  Has the patient been seen for an appointment in the last year OR does the patient have an upcoming appointment? Yes  Can we respond through MyChart? Yes  Agent: Please be advised that Rx refills may take up to 3 business days. We ask that you follow-up with your pharmacy.

## 2023-12-22 DIAGNOSIS — Z131 Encounter for screening for diabetes mellitus: Secondary | ICD-10-CM | POA: Diagnosis not present

## 2024-01-11 DIAGNOSIS — Z363 Encounter for antenatal screening for malformations: Secondary | ICD-10-CM | POA: Diagnosis not present

## 2024-01-11 DIAGNOSIS — Z3A19 19 weeks gestation of pregnancy: Secondary | ICD-10-CM | POA: Diagnosis not present

## 2024-01-11 DIAGNOSIS — O99210 Obesity complicating pregnancy, unspecified trimester: Secondary | ICD-10-CM | POA: Diagnosis not present

## 2024-01-16 ENCOUNTER — Ambulatory Visit: Admitting: Medical

## 2024-01-18 ENCOUNTER — Encounter: Payer: Self-pay | Admitting: Medical

## 2024-01-18 DIAGNOSIS — E041 Nontoxic single thyroid nodule: Secondary | ICD-10-CM | POA: Diagnosis not present

## 2024-01-18 DIAGNOSIS — Z3492 Encounter for supervision of normal pregnancy, unspecified, second trimester: Secondary | ICD-10-CM | POA: Diagnosis not present

## 2024-01-18 DIAGNOSIS — R7989 Other specified abnormal findings of blood chemistry: Secondary | ICD-10-CM | POA: Diagnosis not present

## 2024-02-09 DIAGNOSIS — Z3A23 23 weeks gestation of pregnancy: Secondary | ICD-10-CM | POA: Diagnosis not present

## 2024-02-09 DIAGNOSIS — O99210 Obesity complicating pregnancy, unspecified trimester: Secondary | ICD-10-CM | POA: Diagnosis not present

## 2024-02-09 DIAGNOSIS — Z362 Encounter for other antenatal screening follow-up: Secondary | ICD-10-CM | POA: Diagnosis not present

## 2024-03-16 DIAGNOSIS — Z3689 Encounter for other specified antenatal screening: Secondary | ICD-10-CM | POA: Diagnosis not present

## 2024-03-28 DIAGNOSIS — O26849 Uterine size-date discrepancy, unspecified trimester: Secondary | ICD-10-CM | POA: Diagnosis not present

## 2024-03-28 DIAGNOSIS — Z3A30 30 weeks gestation of pregnancy: Secondary | ICD-10-CM | POA: Diagnosis not present

## 2024-03-28 DIAGNOSIS — O3660X0 Maternal care for excessive fetal growth, unspecified trimester, not applicable or unspecified: Secondary | ICD-10-CM | POA: Diagnosis not present

## 2024-04-06 DIAGNOSIS — Z3483 Encounter for supervision of other normal pregnancy, third trimester: Secondary | ICD-10-CM | POA: Diagnosis not present

## 2024-04-06 DIAGNOSIS — Z3482 Encounter for supervision of other normal pregnancy, second trimester: Secondary | ICD-10-CM | POA: Diagnosis not present

## 2024-04-30 DIAGNOSIS — O99213 Obesity complicating pregnancy, third trimester: Secondary | ICD-10-CM | POA: Diagnosis not present

## 2024-04-30 DIAGNOSIS — O99210 Obesity complicating pregnancy, unspecified trimester: Secondary | ICD-10-CM | POA: Diagnosis not present

## 2024-04-30 DIAGNOSIS — Z3A34 34 weeks gestation of pregnancy: Secondary | ICD-10-CM | POA: Diagnosis not present

## 2024-04-30 DIAGNOSIS — Z3A35 35 weeks gestation of pregnancy: Secondary | ICD-10-CM | POA: Diagnosis not present

## 2024-05-10 DIAGNOSIS — Z3685 Encounter for antenatal screening for Streptococcus B: Secondary | ICD-10-CM | POA: Diagnosis not present

## 2024-05-10 DIAGNOSIS — O3663X0 Maternal care for excessive fetal growth, third trimester, not applicable or unspecified: Secondary | ICD-10-CM | POA: Diagnosis not present

## 2024-05-10 DIAGNOSIS — Z3A36 36 weeks gestation of pregnancy: Secondary | ICD-10-CM | POA: Diagnosis not present

## 2024-05-10 DIAGNOSIS — Z3A08 8 weeks gestation of pregnancy: Secondary | ICD-10-CM | POA: Diagnosis not present

## 2024-05-10 LAB — OB RESULTS CONSOLE GBS: GBS: NEGATIVE

## 2024-05-16 DIAGNOSIS — O3660X0 Maternal care for excessive fetal growth, unspecified trimester, not applicable or unspecified: Secondary | ICD-10-CM | POA: Diagnosis not present

## 2024-05-16 DIAGNOSIS — O99213 Obesity complicating pregnancy, third trimester: Secondary | ICD-10-CM | POA: Diagnosis not present

## 2024-05-16 DIAGNOSIS — Z3A37 37 weeks gestation of pregnancy: Secondary | ICD-10-CM | POA: Diagnosis not present

## 2024-05-18 DIAGNOSIS — O2603 Excessive weight gain in pregnancy, third trimester: Secondary | ICD-10-CM | POA: Diagnosis not present

## 2024-05-18 DIAGNOSIS — Z3A37 37 weeks gestation of pregnancy: Secondary | ICD-10-CM | POA: Diagnosis not present

## 2024-05-18 DIAGNOSIS — O99210 Obesity complicating pregnancy, unspecified trimester: Secondary | ICD-10-CM | POA: Diagnosis not present

## 2024-05-21 DIAGNOSIS — O3660X0 Maternal care for excessive fetal growth, unspecified trimester, not applicable or unspecified: Secondary | ICD-10-CM | POA: Diagnosis not present

## 2024-05-21 DIAGNOSIS — Z3A37 37 weeks gestation of pregnancy: Secondary | ICD-10-CM | POA: Diagnosis not present

## 2024-05-24 ENCOUNTER — Telehealth (HOSPITAL_COMMUNITY): Payer: Self-pay | Admitting: *Deleted

## 2024-05-24 ENCOUNTER — Encounter (HOSPITAL_COMMUNITY): Payer: Self-pay | Admitting: *Deleted

## 2024-05-24 NOTE — Telephone Encounter (Signed)
 Preadmission screen

## 2024-05-24 NOTE — Patient Instructions (Signed)
 Sarah Davidson  05/24/2024   Your procedure is scheduled on:  06/01/2024  Arrive at 0800 at Entrance C on [address] at [facility]  and [employer]. You are invited to use the FREE valet parking or use the Visitor's parking deck.  Pick up the phone at the desk and dial [phone].  Call this number if you have problems the morning of surgery: [phone]  Remember:   Do not eat food:(After Midnight) Desps de medianoche.    Take these medicines the morning of surgery with A SIP OF WATER:  Bring your inhaler   Do not wear jewelry, make-up or nail polish.  Do not wear lotions, powders, or perfumes. Do not wear deodorant.  Do not shave 48 hours prior to surgery.  Do not bring valuables to the hospital.  [facility] is not   responsible for any belongings or valuables brought to the hospital.  Contacts, dentures or bridgework may not be worn into surgery.  Leave suitcase in the car. After surgery it may be brought to your room.  For patients admitted to the hospital, checkout time is 11:00 AM the day of              discharge.      Please read over the following fact sheets that you were given:     Preparing for Surgery

## 2024-05-25 ENCOUNTER — Telehealth (HOSPITAL_COMMUNITY): Payer: Self-pay | Admitting: *Deleted

## 2024-05-25 ENCOUNTER — Encounter (HOSPITAL_COMMUNITY): Payer: Self-pay

## 2024-05-25 NOTE — Telephone Encounter (Signed)
 Preadmission screen

## 2024-05-29 DIAGNOSIS — O3660X0 Maternal care for excessive fetal growth, unspecified trimester, not applicable or unspecified: Secondary | ICD-10-CM | POA: Diagnosis not present

## 2024-05-29 DIAGNOSIS — Z3A39 39 weeks gestation of pregnancy: Secondary | ICD-10-CM | POA: Diagnosis not present

## 2024-05-29 DIAGNOSIS — O99213 Obesity complicating pregnancy, third trimester: Secondary | ICD-10-CM | POA: Diagnosis not present

## 2024-05-30 ENCOUNTER — Encounter (HOSPITAL_COMMUNITY)
Admission: RE | Admit: 2024-05-30 | Discharge: 2024-05-30 | Disposition: A | Discharge status: Home / Self Care | Source: Ambulatory Visit | Attending: Obstetrics and Gynecology | Admitting: Obstetrics and Gynecology

## 2024-05-30 DIAGNOSIS — Z01812 Encounter for preprocedural laboratory examination: Secondary | ICD-10-CM | POA: Insufficient documentation

## 2024-05-30 DIAGNOSIS — O3660X0 Maternal care for excessive fetal growth, unspecified trimester, not applicable or unspecified: Secondary | ICD-10-CM

## 2024-05-30 LAB — CBC
HCT: 32.7 % — ABNORMAL LOW (ref 36.0–46.0)
Hemoglobin: 10.2 g/dL — ABNORMAL LOW (ref 12.0–15.0)
MCH: 25.9 pg — ABNORMAL LOW (ref 26.0–34.0)
MCHC: 31.2 g/dL (ref 30.0–36.0)
MCV: 83 fL (ref 80.0–100.0)
Platelets: 190 K/uL (ref 150–400)
RBC: 3.94 MIL/uL (ref 3.87–5.11)
RDW: 14.1 % (ref 11.5–15.5)
WBC: 10.5 K/uL (ref 4.0–10.5)
nRBC: 0 % (ref 0.0–0.2)

## 2024-05-30 LAB — RPR: RPR Ser Ql: NONREACTIVE

## 2024-05-30 LAB — TYPE AND SCREEN
ABO/RH(D): A POS
Antibody Screen: NEGATIVE

## 2024-05-30 NOTE — Anesthesia Preprocedure Evaluation (Addendum)
 Anesthesia Evaluation  Patient identified by MRN, date of birth, ID band Patient awake    Reviewed: Allergy & Precautions, NPO status , Patient's Chart, lab work & pertinent test results  History of Anesthesia Complications Negative for: history of anesthetic complications  Airway Mallampati: I  TM Distance: >3 FB Neck ROM: Full    Dental   Pulmonary neg shortness of breath, asthma (exercise-induced, last inhaler use years ago) , neg sleep apnea, neg COPD, neg recent URI, Not current smoker   Pulmonary exam normal breath sounds clear to auscultation       Cardiovascular negative cardio ROS  Rhythm:Regular Rate:Normal     Neuro/Psych  PSYCHIATRIC DISORDERS (ADHD) Anxiety     negative neurological ROS     GI/Hepatic Neg liver ROS,GERD (taking Tums)  ,,  Endo/Other  neg diabetes  Class 3 obesityThyroid nodules  Renal/GU negative Renal ROS     Musculoskeletal   Abdominal  (+) + obese  Peds  Hematology  (+) Blood dyscrasia, anemia Lab Results      Component                Value               Date                      WBC                      10.5                05/30/2024                HGB                      10.2 (L)            05/30/2024                HCT                      32.7 (L)            05/30/2024                MCV                      83.0                05/30/2024                PLT                      190                 05/30/2024              Anesthesia Other Findings   Reproductive/Obstetrics (+) Pregnancy                              Anesthesia Physical Anesthesia Plan  ASA: 3  Anesthesia Plan: Spinal   Post-op Pain Management:    Induction:   PONV Risk Score and Plan: 2 and Ondansetron , Dexamethasone and Treatment may vary due to age or medical condition  Airway Management Planned: Natural Airway  Additional Equipment:   Intra-op Plan:   Post-operative  Plan:   Informed Consent: I have reviewed the  patients History and Physical, chart, labs and discussed the procedure including the risks, benefits and alternatives for the proposed anesthesia with the patient or authorized representative who has indicated his/her understanding and acceptance.       Plan Discussed with: Anesthesiologist and CRNA  Anesthesia Plan Comments: (I have discussed risks of neuraxial anesthesia including but not limited to infection, bleeding, nerve injury, back pain, headache, seizures, and failure of block. Patient denies bleeding disorders and is not currently anticoagulated. Labs have been reviewed. Risks and benefits discussed. All patient's questions answered.  )         Anesthesia Quick Evaluation

## 2024-06-01 ENCOUNTER — Inpatient Hospital Stay (HOSPITAL_COMMUNITY): Payer: Self-pay | Admitting: Anesthesiology

## 2024-06-01 ENCOUNTER — Inpatient Hospital Stay (HOSPITAL_COMMUNITY)
Admission: RE | Admit: 2024-06-01 | Discharge: 2024-06-03 | DRG: 787 | Disposition: A | Discharge status: Home / Self Care | Attending: Obstetrics and Gynecology | Admitting: Obstetrics and Gynecology

## 2024-06-01 ENCOUNTER — Other Ambulatory Visit: Payer: Self-pay

## 2024-06-01 ENCOUNTER — Encounter (HOSPITAL_COMMUNITY): Payer: Self-pay | Admitting: Obstetrics and Gynecology

## 2024-06-01 ENCOUNTER — Encounter (HOSPITAL_COMMUNITY): Admission: RE | Disposition: A | Payer: Self-pay | Source: Home / Self Care | Attending: Obstetrics and Gynecology

## 2024-06-01 DIAGNOSIS — O9962 Diseases of the digestive system complicating childbirth: Secondary | ICD-10-CM | POA: Diagnosis not present

## 2024-06-01 DIAGNOSIS — O134 Gestational [pregnancy-induced] hypertension without significant proteinuria, complicating childbirth: Secondary | ICD-10-CM | POA: Diagnosis present

## 2024-06-01 DIAGNOSIS — Z833 Family history of diabetes mellitus: Secondary | ICD-10-CM

## 2024-06-01 DIAGNOSIS — Z3A39 39 weeks gestation of pregnancy: Secondary | ICD-10-CM | POA: Diagnosis not present

## 2024-06-01 DIAGNOSIS — Z8249 Family history of ischemic heart disease and other diseases of the circulatory system: Secondary | ICD-10-CM

## 2024-06-01 DIAGNOSIS — O3660X0 Maternal care for excessive fetal growth, unspecified trimester, not applicable or unspecified: Principal | ICD-10-CM | POA: Diagnosis present

## 2024-06-01 DIAGNOSIS — O3663X0 Maternal care for excessive fetal growth, third trimester, not applicable or unspecified: Principal | ICD-10-CM | POA: Diagnosis present

## 2024-06-01 DIAGNOSIS — O99214 Obesity complicating childbirth: Secondary | ICD-10-CM | POA: Diagnosis not present

## 2024-06-01 DIAGNOSIS — O3660X1 Maternal care for excessive fetal growth, unspecified trimester, fetus 1: Secondary | ICD-10-CM | POA: Diagnosis not present

## 2024-06-01 DIAGNOSIS — Z3490 Encounter for supervision of normal pregnancy, unspecified, unspecified trimester: Secondary | ICD-10-CM

## 2024-06-01 DIAGNOSIS — D62 Acute posthemorrhagic anemia: Secondary | ICD-10-CM | POA: Diagnosis not present

## 2024-06-01 DIAGNOSIS — O9081 Anemia of the puerperium: Secondary | ICD-10-CM | POA: Diagnosis not present

## 2024-06-01 DIAGNOSIS — K219 Gastro-esophageal reflux disease without esophagitis: Secondary | ICD-10-CM | POA: Diagnosis not present

## 2024-06-01 DIAGNOSIS — E66813 Obesity, class 3: Secondary | ICD-10-CM | POA: Diagnosis not present

## 2024-06-01 LAB — COMPREHENSIVE METABOLIC PANEL WITH GFR
ALT: 17 U/L (ref 0–44)
AST: 28 U/L (ref 15–41)
Albumin: 2.4 g/dL — ABNORMAL LOW (ref 3.5–5.0)
Alkaline Phosphatase: 94 U/L (ref 38–126)
Anion gap: 9 (ref 5–15)
BUN: 9 mg/dL (ref 6–20)
CO2: 21 mmol/L — ABNORMAL LOW (ref 22–32)
Calcium: 8.8 mg/dL — ABNORMAL LOW (ref 8.9–10.3)
Chloride: 106 mmol/L (ref 98–111)
Creatinine, Ser: 0.68 mg/dL (ref 0.44–1.00)
GFR, Estimated: >60 mL/min (ref >60)
Glucose, Bld: 109 mg/dL — ABNORMAL HIGH (ref 70–99)
Potassium: 4.3 mmol/L (ref 3.5–5.1)
Sodium: 136 mmol/L (ref 135–145)
Total Bilirubin: 0.5 mg/dL (ref 0.0–1.2)
Total Protein: 5.6 g/dL — ABNORMAL LOW (ref 6.5–8.1)

## 2024-06-01 LAB — CBC
HCT: 31.4 % — ABNORMAL LOW (ref 36.0–46.0)
Hemoglobin: 10.1 g/dL — ABNORMAL LOW (ref 12.0–15.0)
MCH: 26.4 pg (ref 26.0–34.0)
MCHC: 32.2 g/dL (ref 30.0–36.0)
MCV: 82 fL (ref 80.0–100.0)
Platelets: 186 K/uL (ref 150–400)
RBC: 3.83 MIL/uL — ABNORMAL LOW (ref 3.87–5.11)
RDW: 14.3 % (ref 11.5–15.5)
WBC: 11.9 K/uL — ABNORMAL HIGH (ref 4.0–10.5)
nRBC: 0 % (ref 0.0–0.2)

## 2024-06-01 LAB — CREATININE, SERUM
Creatinine, Ser: 0.67 mg/dL (ref 0.44–1.00)
GFR, Estimated: >60 mL/min (ref >60)

## 2024-06-01 LAB — ABO/RH: ABO/RH(D): A POS

## 2024-06-01 SURGERY — Surgical Case
Anesthesia: Spinal | Site: Abdomen

## 2024-06-01 MED ORDER — ONDANSETRON HCL 4 MG/2ML IJ SOLN
4.0000 mg | Freq: Three times a day (TID) | INTRAMUSCULAR | Status: DC | PRN
Start: 2024-06-01 — End: 2024-06-03

## 2024-06-01 MED ORDER — OXYTOCIN-SODIUM CHLORIDE 30-0.9 UT/500ML-% IV SOLN
2.5000 [IU]/h | INTRAVENOUS | Status: AC
  Administered 2024-06-01: 2.5 [IU]/h via INTRAVENOUS
  Filled 2024-06-01: qty 500

## 2024-06-01 MED ORDER — ACETAMINOPHEN 10 MG/ML IV SOLN
INTRAVENOUS | Status: AC
  Filled 2024-06-01: qty 100

## 2024-06-01 MED ORDER — ACETAMINOPHEN 500 MG PO TABS
1000.0000 mg | ORAL_TABLET | Freq: Four times a day (QID) | ORAL | Status: DC
  Administered 2024-06-01 – 2024-06-03 (×8): 1000 mg via ORAL
  Filled 2024-06-01 (×8): qty 2

## 2024-06-01 MED ORDER — ENOXAPARIN SODIUM 80 MG/0.8ML IJ SOSY
70.0000 mg | PREFILLED_SYRINGE | INTRAMUSCULAR | Status: DC
  Administered 2024-06-02 – 2024-06-03 (×2): 70 mg via SUBCUTANEOUS
  Filled 2024-06-01 (×2): qty 0.8

## 2024-06-01 MED ORDER — OXYCODONE HCL 5 MG PO TABS: 5.0000 mg | ORAL_TABLET | ORAL | Status: DC | PRN

## 2024-06-01 MED ORDER — WITCH HAZEL-GLYCERIN EX PADS: 1.0000 | MEDICATED_PAD | CUTANEOUS | Status: DC | PRN

## 2024-06-01 MED ORDER — KETOROLAC TROMETHAMINE 30 MG/ML IJ SOLN: 30.0000 mg | Freq: Four times a day (QID) | INTRAMUSCULAR | Status: AC | PRN

## 2024-06-01 MED ORDER — FENTANYL CITRATE (PF) 100 MCG/2ML IJ SOLN
INTRAMUSCULAR | Status: AC
  Filled 2024-06-01: qty 2

## 2024-06-01 MED ORDER — MENTHOL 3 MG MT LOZG: 1.0000 | LOZENGE | OROMUCOSAL | Status: DC | PRN

## 2024-06-01 MED ORDER — DIBUCAINE (PERIANAL) 1 % EX OINT: 1.0000 | TOPICAL_OINTMENT | CUTANEOUS | Status: DC | PRN

## 2024-06-01 MED ORDER — MORPHINE SULFATE (PF) 0.5 MG/ML IJ SOLN
INTRAMUSCULAR | Status: DC | PRN
  Administered 2024-06-01: 150 ug via INTRATHECAL

## 2024-06-01 MED ORDER — TRANEXAMIC ACID-NACL 1000-0.7 MG/100ML-% IV SOLN
INTRAVENOUS | Status: AC
  Filled 2024-06-01: qty 100

## 2024-06-01 MED ORDER — DIPHENHYDRAMINE HCL 50 MG/ML IJ SOLN: 12.5000 mg | Freq: Four times a day (QID) | INTRAMUSCULAR | Status: DC | PRN

## 2024-06-01 MED ORDER — NALOXONE HCL 0.4 MG/ML IJ SOLN: 0.4000 mg | INTRAMUSCULAR | Status: DC | PRN

## 2024-06-01 MED ORDER — SIMETHICONE 80 MG PO CHEW
80.0000 mg | CHEWABLE_TABLET | Freq: Three times a day (TID) | ORAL | Status: DC
  Administered 2024-06-01 – 2024-06-03 (×7): 80 mg via ORAL
  Filled 2024-06-01 (×7): qty 1

## 2024-06-01 MED ORDER — DIPHENHYDRAMINE HCL 25 MG PO CAPS: 25.0000 mg | ORAL_CAPSULE | Freq: Four times a day (QID) | ORAL | Status: DC | PRN

## 2024-06-01 MED ORDER — PHENYLEPHRINE HCL-NACL 20-0.9 MG/250ML-% IV SOLN
INTRAVENOUS | Status: DC | PRN
  Administered 2024-06-01: 60 ug/min via INTRAVENOUS

## 2024-06-01 MED ORDER — CEFAZOLIN SODIUM-DEXTROSE 3-4 GM/150ML-% IV SOLN
3.0000 g | INTRAVENOUS | Status: AC
  Administered 2024-06-01: 3 g via INTRAVENOUS

## 2024-06-01 MED ORDER — ONDANSETRON HCL 4 MG/2ML IJ SOLN
INTRAMUSCULAR | Status: AC
  Filled 2024-06-01: qty 2

## 2024-06-01 MED ORDER — FENTANYL CITRATE (PF) 100 MCG/2ML IJ SOLN: 25.0000 ug | INTRAMUSCULAR | Status: DC | PRN

## 2024-06-01 MED ORDER — IBUPROFEN 600 MG PO TABS
600.0000 mg | ORAL_TABLET | Freq: Four times a day (QID) | ORAL | Status: DC
  Administered 2024-06-02 – 2024-06-03 (×5): 600 mg via ORAL
  Filled 2024-06-01 (×5): qty 1

## 2024-06-01 MED ORDER — NIFEDIPINE ER OSMOTIC RELEASE 30 MG PO TB24
30.0000 mg | ORAL_TABLET | Freq: Every day | ORAL | Status: DC
  Administered 2024-06-01 – 2024-06-03 (×3): 30 mg via ORAL
  Filled 2024-06-01 (×4): qty 1

## 2024-06-01 MED ORDER — KETOROLAC TROMETHAMINE 30 MG/ML IJ SOLN
30.0000 mg | Freq: Once | INTRAMUSCULAR | Status: AC | PRN
  Administered 2024-06-01: 30 mg via INTRAVENOUS

## 2024-06-01 MED ORDER — SENNOSIDES-DOCUSATE SODIUM 8.6-50 MG PO TABS
2.0000 | ORAL_TABLET | Freq: Every day | ORAL | Status: DC
  Administered 2024-06-02: 2 via ORAL
  Filled 2024-06-01 (×2): qty 2

## 2024-06-01 MED ORDER — DIPHENHYDRAMINE HCL 25 MG PO CAPS
25.0000 mg | ORAL_CAPSULE | Freq: Four times a day (QID) | ORAL | Status: DC | PRN
Start: 2024-06-01 — End: 2024-06-03

## 2024-06-01 MED ORDER — COCONUT OIL OIL: 1.0000 | TOPICAL_OIL | Status: DC | PRN

## 2024-06-01 MED ORDER — SODIUM CHLORIDE 0.9 % IR SOLN
Status: DC | PRN
  Administered 2024-06-01: 1000 mL

## 2024-06-01 MED ORDER — DEXAMETHASONE SOD PHOSPHATE PF 10 MG/ML IJ SOLN
INTRAMUSCULAR | Status: DC | PRN
  Administered 2024-06-01: 10 mg via INTRAVENOUS

## 2024-06-01 MED ORDER — POVIDONE-IODINE 10 % EX SWAB
2.0000 | Freq: Once | CUTANEOUS | Status: AC
  Administered 2024-06-01: 2 via TOPICAL

## 2024-06-01 MED ORDER — PROMETHAZINE (PHENERGAN) 6.25MG IN NS 50ML IVPB: 6.2500 mg | INTRAVENOUS | Status: DC | PRN

## 2024-06-01 MED ORDER — OXYTOCIN-SODIUM CHLORIDE 30-0.9 UT/500ML-% IV SOLN
INTRAVENOUS | Status: AC
  Filled 2024-06-01: qty 500

## 2024-06-01 MED ORDER — SIMETHICONE 80 MG PO CHEW: 80.0000 mg | CHEWABLE_TABLET | ORAL | Status: DC | PRN

## 2024-06-01 MED ORDER — BUPIVACAINE IN DEXTROSE 0.75-8.25 % IT SOLN
INTRATHECAL | Status: DC | PRN
  Administered 2024-06-01: 1.6 mL via INTRATHECAL

## 2024-06-01 MED ORDER — SODIUM CHLORIDE 0.9% FLUSH: 3.0000 mL | INTRAVENOUS | Status: DC | PRN

## 2024-06-01 MED ORDER — PHENYLEPHRINE HCL-NACL 20-0.9 MG/250ML-% IV SOLN
INTRAVENOUS | Status: AC
  Filled 2024-06-01: qty 250

## 2024-06-01 MED ORDER — FENTANYL CITRATE (PF) 100 MCG/2ML IJ SOLN
INTRAMUSCULAR | Status: DC | PRN
  Administered 2024-06-01: 15 ug via INTRATHECAL

## 2024-06-01 MED ORDER — ACETAMINOPHEN 500 MG PO TABS: 1000.0000 mg | ORAL_TABLET | Freq: Four times a day (QID) | ORAL | Status: DC

## 2024-06-01 MED ORDER — OXYTOCIN-SODIUM CHLORIDE 30-0.9 UT/500ML-% IV SOLN
INTRAVENOUS | Status: DC | PRN
  Administered 2024-06-01: 300 mL via INTRAVENOUS

## 2024-06-01 MED ORDER — ZOLPIDEM TARTRATE 5 MG PO TABS: 5.0000 mg | ORAL_TABLET | Freq: Every evening | ORAL | Status: DC | PRN

## 2024-06-01 MED ORDER — CEFAZOLIN SODIUM-DEXTROSE 3-4 GM/150ML-% IV SOLN
INTRAVENOUS | Status: AC
  Filled 2024-06-01: qty 150

## 2024-06-01 MED ORDER — KETOROLAC TROMETHAMINE 30 MG/ML IJ SOLN
INTRAMUSCULAR | Status: AC
  Filled 2024-06-01: qty 1

## 2024-06-01 MED ORDER — STERILE WATER FOR IRRIGATION IR SOLN
Status: DC | PRN
  Administered 2024-06-01: 1000 mL

## 2024-06-01 MED ORDER — TRANEXAMIC ACID-NACL 1000-0.7 MG/100ML-% IV SOLN
1000.0000 mg | Freq: Once | INTRAVENOUS | Status: AC
  Administered 2024-06-01: 1000 mg via INTRAVENOUS

## 2024-06-01 MED ORDER — LACTATED RINGERS IV SOLN: INTRAVENOUS | Status: DC

## 2024-06-01 MED ORDER — KETOROLAC TROMETHAMINE 30 MG/ML IJ SOLN
30.0000 mg | Freq: Four times a day (QID) | INTRAMUSCULAR | Status: AC
  Administered 2024-06-01 – 2024-06-02 (×3): 30 mg via INTRAVENOUS
  Filled 2024-06-01 (×3): qty 1

## 2024-06-01 MED ORDER — ONDANSETRON HCL 4 MG/2ML IJ SOLN
INTRAMUSCULAR | Status: DC | PRN
  Administered 2024-06-01: 4 mg via INTRAVENOUS

## 2024-06-01 MED ORDER — MORPHINE SULFATE (PF) 0.5 MG/ML IJ SOLN
INTRAMUSCULAR | Status: AC
  Filled 2024-06-01: qty 10

## 2024-06-01 MED ORDER — PRENATAL MULTIVITAMIN CH
1.0000 | ORAL_TABLET | Freq: Every day | ORAL | Status: DC
  Administered 2024-06-01 – 2024-06-03 (×3): 1 via ORAL
  Filled 2024-06-01 (×3): qty 1

## 2024-06-01 SURGICAL SUPPLY — 35 items
BENZOIN TINCTURE PRP APPL 2/3 (GAUZE/BANDAGES/DRESSINGS) IMPLANT
CHLORAPREP W/TINT 26 (MISCELLANEOUS) ×2 IMPLANT
CLAMP UMBILICAL CORD (MISCELLANEOUS) ×1 IMPLANT
CLOTH BEACON ORANGE TIMEOUT ST (SAFETY) ×1 IMPLANT
DERMABOND ADVANCED .7 DNX12 (GAUZE/BANDAGES/DRESSINGS) ×1 IMPLANT
DRSG OPSITE POSTOP 4X10 (GAUZE/BANDAGES/DRESSINGS) ×1 IMPLANT
ELECTRODE REM PT RTRN 9FT ADLT (ELECTROSURGICAL) ×1 IMPLANT
EXTRACTOR VACUUM BELL STYLE (SUCTIONS) IMPLANT
GAUZE PAD ABD 7.5X8 STRL (GAUZE/BANDAGES/DRESSINGS) IMPLANT
GAUZE SPONGE 4X4 12PLY STRL LF (GAUZE/BANDAGES/DRESSINGS) IMPLANT
GLOVE BIO SURGEON STRL SZ 6.5 (GLOVE) ×1 IMPLANT
GLOVE BIOGEL PI IND STRL 6.5 (GLOVE) ×1 IMPLANT
GLOVE BIOGEL PI IND STRL 7.0 (GLOVE) ×2 IMPLANT
GOWN STRL REUS W/TWL LRG LVL3 (GOWN DISPOSABLE) ×2 IMPLANT
KIT ABG SYR 3ML LUER SLIP (SYRINGE) IMPLANT
MAT PREVALON FULL STRYKER (MISCELLANEOUS) IMPLANT
NDL HYPO 25X5/8 SAFETYGLIDE (NEEDLE) IMPLANT
NEEDLE HYPO 25X5/8 SAFETYGLIDE (NEEDLE) IMPLANT
NS IRRIG 1000ML POUR BTL (IV SOLUTION) ×1 IMPLANT
PACK C SECTION WH (CUSTOM PROCEDURE TRAY) ×1 IMPLANT
PAD ABD 8X10 STRL (GAUZE/BANDAGES/DRESSINGS) IMPLANT
PAD OB MATERNITY 4.3X12.25 (PERSONAL CARE ITEMS) ×1 IMPLANT
RTRCTR C-SECT PINK 25CM LRG (MISCELLANEOUS) IMPLANT
STRIP CLOSURE SKIN 1/2X4 (GAUZE/BANDAGES/DRESSINGS) IMPLANT
SUT PDS AB 0 CTX 36 PDP370T (SUTURE) IMPLANT
SUT PLAIN ABS 2-0 CT1 27XMFL (SUTURE) IMPLANT
SUT VIC AB 0 CT1 36 (SUTURE) ×2 IMPLANT
SUT VIC AB 2-0 CT1 TAPERPNT 27 (SUTURE) ×1 IMPLANT
SUT VIC AB 3-0 SH 27X BRD (SUTURE) IMPLANT
SUT VIC AB 4-0 KS 27 (SUTURE) ×1 IMPLANT
SUTURE PLAIN GUT 2.0 ETHICON (SUTURE) IMPLANT
TAPE CLOTH SURG 4X10 WHT LF (GAUZE/BANDAGES/DRESSINGS) IMPLANT
TOWEL OR 17X24 6PK STRL BLUE (TOWEL DISPOSABLE) ×1 IMPLANT
TRAY FOLEY W/BAG SLVR 14FR LF (SET/KITS/TRAYS/PACK) ×1 IMPLANT
WATER STERILE IRR 1000ML POUR (IV SOLUTION) ×1 IMPLANT

## 2024-06-01 NOTE — Lactation Note (Addendum)
 This note was copied from a baby's chart. Lactation Consultation Note  Patient Name: Sarah Davidson Date: 06/01/2024 Age:30 hours Reason for consult: Initial assessment;1st time breastfeeding;RN request  P1, Wyvonna RN assisting family with latching on the L breast when LC entered the room. Baby was latched in football hold.  Baby unlatched.  Demonstrated to father of baby how to latch baby and position pillows under baby to bring to nipple height. Repositioned baby to the R breast in football hold.   Baby latched with wide open gape.  Baby eager.  Lips flanged.  Mother had questions about her DEBP and when to pump if bottle feeding once home.  Discussed pumping within a 3 hours window to stimulate her supply.   Maternal Data Does the patient have breastfeeding experience prior to this delivery?: No  Feeding Mother's Current Feeding Choice: Breast Milk  LATCH Score Latch: Grasps breast easily, tongue down, lips flanged, rhythmical sucking.  Audible Swallowing: A few with stimulation  Type of Nipple: Everted at rest and after stimulation  Comfort (Breast/Nipple): Soft / non-tender  Hold (Positioning): Assistance needed to correctly position infant at breast and maintain latch.  LATCH Score: 8  Interventions Interventions: Breast feeding basics reviewed;Assisted with latch;Skin to skin;Education, Lactation brochure, CDC breastmilk storage guidelines  Discharge Pump: Personal;DEBP (Spectra )  Consult Status Consult Status: Follow-up Date: 06/02/24 Follow-up type: In-patient   Shannon Levorn Lemme  RN, IBCLC 06/01/2024, 1:18 PM

## 2024-06-01 NOTE — H&P (Signed)
 Sarah Davidson is a 28 y.o. female presenting for scheduled procedure. +FM, denies VB, LOF, cramping.   PNC c/b 1) Maternal obesity - BMI 36 at workup 117, on baby ASA 2) Large for gestational age fetus - GS serial ordered given S>D, most recent scan at 35 6/7 EFW 4125g (96%), AC 38, AFI 13.  3) Thyroid  nodules - followed by endocrinology    *From 4/10 thyroid  scan: Nodule 1 (TI-RADS 3), measuring 2.2 cm, located in the mid right thyroid  lobe meets criteria for imaging follow-up. Annual ultrasound surveillance is recommended until 5 years of stability is documented.   She has been extensively counseled on PPH and SD given LGA. Her main concern is a long IOL followed by need for c-section, either for AoDi or for fetal indications. She has an extensive history of c-sections on her mom's side. We reviewed R/B of IOL vs elective primary section both now and with future pregnancies (planning likely 2 more). Reviewed she does not meet critera for macrosomia. After shared decision making, she decided on primary section for maternal request. Has family history of LGA infants as well with her mother requiring c-section for delivery   OB History     Gravida  2   Para      Term      Preterm      AB  1   Living         SAB      IAB  1   Ectopic      Multiple      Live Births             Past Medical History:  Diagnosis Date   ADHD (attention deficit hyperactivity disorder) 08/16/2012   Anxiety    Asthma    Past Surgical History:  Procedure Laterality Date   TYMPANOSTOMY     Family History: family history includes Diabetes in her father; Heart attack in her father; Hypertension in her father and mother. Social History:  reports that she has never smoked. She has never used smokeless tobacco. She reports current alcohol use. She reports that she does not use drugs.     Maternal Diabetes: No1hr 124 Genetic Screening: Normal Maternal Ultrasounds/Referrals:  Normal Fetal Ultrasounds or other Referrals:  None Maternal Substance Abuse:  No Significant Maternal Medications:  None Significant Maternal Lab Results:  Group B Strep negative Number of Prenatal Visits:greater than 3 verified prenatal visits Maternal Vaccinations:TDap and Flu Other Comments:  None  Review of Systems  Constitutional:  Negative for chills and fever.  Respiratory:  Negative for shortness of breath.   Cardiovascular:  Negative for chest pain, palpitations and leg swelling.  Gastrointestinal:  Negative for abdominal pain and vomiting.  Neurological:  Negative for dizziness, weakness and headaches.  Psychiatric/Behavioral:  Negative for suicidal ideas. The patient is nervous/anxious.    Maternal Medical History:  Contractions: Frequency: regular.   Fetal activity: Perceived fetal activity is normal.   Prenatal complications: No bleeding.   Prenatal Complications - Diabetes: none.     Height 5' 10 (1.778 m), weight (!) 139.6 kg. Exam Physical Exam Constitutional:      General: She is not in acute distress.    Appearance: She is well-developed.  HENT:     Head: Normocephalic and atraumatic.  Eyes:     Pupils: Pupils are equal, round, and reactive to light.  Cardiovascular:     Rate and Rhythm: Normal rate and regular rhythm.     Heart  sounds: No murmur heard.    No gallop.  Abdominal:     Tenderness: There is no abdominal tenderness. There is no guarding or rebound.  Genitourinary:    Vagina: Normal.  Musculoskeletal:        General: Normal range of motion.     Cervical back: Normal range of motion and neck supple.  Skin:    General: Skin is warm and dry.  Neurological:     Mental Status: She is alert and oriented to person, place, and time.     Prenatal labs: ABO, Rh: --/--/A POS (10/15 1022) Antibody: NEG (10/15 1022) Rubella: Immune (03/24 0000) RPR: NON REACTIVE (10/15 1017)  HBsAg: Negative (03/24 0000)  HIV: Non-reactive (03/24 0000)  GBS:  Negative/-- (09/25 0000)   Assessment/Plan: This is a 28yo G1 @ 39 3/7 by LMP c/w 9wk TVUS admitted for elective PLTCS for LGA infant. Please see HPI for full discussion. R/B/A of cesarean section discussed with patient. Alternative would be vaginal delivery which would mean shorter postpartum stay and decreased risk of bleeding. Risks of section include infection of the uterus, pelvic organs, or skin, inadvertent injury to internal organs, such as bowel or bladder. If there is major injury, extensive surgery may be required. If injury is minor, it may be treated with relative ease. Discussed possibility of excessive blood loss and transfusion. If bleeding cannot be controlled using medical or minor surgical methods, a cesarean hysterectomy may be performed which would mean no future fertility. Patient accepts the possibility of blood transfusion, if necessary. Patient understands and agrees to move forward with section.   Sarah Davidson 06/01/2024, 8:22 AM

## 2024-06-01 NOTE — Op Note (Signed)
 C-Section Operative Note  Date: 06/01/24  Preoperative Diagnosis: IUP @ 39 3/7 Postoperative Diagnosis: same as above, LGA Procedure: primary low transverse cesarean section without extension Indication: concern for macrosomic infant, elective primary Findings: Viable female  infant weighing 10lb2oz/4600g with APGARS of 8 and 9 at 1 and 5 minutes, respectively. Normal appearing uterus, bilateral fallopian tubes and ovaries. Specimens: placenta to L&D EBL 950cc IVF 1500 UOP 200  Consent:  R/B/A of cesarean section discussed with patient. Alternative would be vaginal delivery which would mean shorter postpartum stay and decreased risk of bleeding. Risks of section include infection of the uterus, pelvic organs, or skin, inadvertent injury to internal organs, such as bowel or bladder. If there is major injury, extensive surgery may be required. If injury is minor, it may be treated with relative ease. Discussed possibility of excessive blood loss and transfusion. If bleeding cannot be controlled using medical or minor surgical methods, a cesarean hysterectomy may be performed which would mean no future fertility. Patient accepts the possibility of blood transfusion, if necessary. Patient understands and agrees to move forward with section.   Operative Procedure: Patient was taken to the operating room where epidural anesthesia was found to be adequate by Allis clamp test. TRAXI and 3g Ancef given. She was prepped and draped in the normal sterile fashion in the dorsal supine position with a leftward tilt. An appropriate time out was performed. A Pfannenstiel skin incision was then made with the scalpel and carried through to the underlying layer of fascia by sharp dissection and Bovie cautery. The fascia was nicked in the midline and the incision was extended laterally with Mayo scissors. The superior aspect of the incision was grasped Kocher clamps and dissected off the underlying rectus muscles. In a  similar fashion the inferior aspect was dissected off the rectus muscles. Rectus muscles were separated in the midline and the peritoneal cavity entered bluntly. The peritoneal incision was then extended both superiorly and inferiorly with careful attention to avoid both bowel and bladder. The Alexis self-retaining wound retractor was then placed within the incision and the lower uterine segment exposed. The bladder flap was developed with Metzenbaum scissors and pushed away from the lower uterine segment. The lower uterine segment was then incised in a low transverse fashion and the cavity itself entered bluntly. The incision was extended bluntly. Amniotic sac was ruptured and fluid was noted to be copious and clear in color. The infant's head was then lifted and delivered from the incision without difficulty using the standard movements. The remainder of the infant delivered and the nose and mouth bulb suctioned with the cord clamped and cut as well. The infant was handed off to NICU. TXA given. The placenta was then spontaneously expressed from the uterus and the uterus cleared of all clots and debris with moist lap sponge. The uterine incision was then repaired in 2 layers the first layer was a running locked layer of 0-vicryl and the second an imbricating layer of the same suture. The tubes and ovaries were inspected and the gutters cleared of all clots and debris. The uterine incision was inspected and found to be hemostatic. All instruments and sponges as well as the Alexis retractor were then removed from the abdomen. The peritoneum was then reapproximated using 2-0 vicryl suture. The rectus muscles were then reapproximated with several interrupted mattress sutures of 2-0 Vicryl. The fascia was then closed with 0 Vicryl in a running fashion. Subcutaneous tissue was reapproximated with 3-0 plain in a  running fashion. The skin was closed with a subcuticular stitch of 4-0 Vicryl on a Keith needle and then  reinforced with Dermbond and a Honeycomb. At the conclusion of the procedure all instruments and sponge counts were correct. Patient was taken to the recovery room in good condition with her baby accompanying her skin to skin.   Mild range BP in PACU, no HTN history, known anxiety not on meds. Close eye, may start labetalol on mother baby

## 2024-06-01 NOTE — Anesthesia Procedure Notes (Addendum)
 Spinal  Patient location during procedure: OR Start time: 06/01/2024 9:00 AM End time: 06/01/2024 9:05 AM Staffing Performed: other anesthesia staff  Anesthesiologist: Peggye Delon Brunswick, MD Other anesthesia staff: Pecolia Jayson SQUIBB, RN Performed by: Peggye Delon Brunswick, MD Authorized by: Peggye Delon Brunswick, MD   Preanesthetic Checklist Completed: patient identified, IV checked, site marked, risks and benefits discussed, surgical consent, monitors and equipment checked, pre-op evaluation and timeout performed Spinal Block Patient position: sitting Prep: Betasept Patient monitoring: heart rate, continuous pulse ox and blood pressure Approach: midline Location: L3-4 Injection technique: single-shot Needle Needle type: Pencan  Needle gauge: 24 G Assessment Sensory level: T4 Additional Notes Spinal performed by NORVA Jayson Pecolia, 1 attempt. I was present for spinal placement. ADELIA Peggye, MD

## 2024-06-01 NOTE — Progress Notes (Signed)
 BP (!) 155/90 (BP Location: Left Arm)   Pulse 100   Temp 98.3 F (36.8 C) (Oral)   Resp 18   Ht 5' 10 (1.778 m)   Wt (!) 139.6 kg   SpO2 97%   Breastfeeding Unknown   BMI 44.15 kg/m  Asx from HTN standpoint, CMP and uPC ordered, nif 30XL started - avoid beta blocker given h/o asthma

## 2024-06-01 NOTE — Transfer of Care (Signed)
 Immediate Anesthesia Transfer of Care Note  Patient: Sarah Davidson  Procedure(s) Performed: CESAREAN DELIVERY (Abdomen)  Patient Location: PACU  Anesthesia Type:Spinal  Level of Consciousness: awake, alert , and oriented  Airway & Oxygen Therapy: Patient Spontanous Breathing  Post-op Assessment: Report given to RN and Post -op Vital signs reviewed and stable  Post vital signs: Reviewed and stable  Last Vitals:  Vitals Value Taken Time  BP 154/91 06/01/24 10:30  Temp    Pulse 108 06/01/24 10:33  Resp 22 06/01/24 10:33  SpO2 98 % 06/01/24 10:33  Vitals shown include unfiled device data.  Last Pain:  Vitals:   06/01/24 0821  TempSrc: Oral         Complications: No notable events documented.

## 2024-06-01 NOTE — Anesthesia Postprocedure Evaluation (Signed)
 Anesthesia Post Note  Patient: Sarah Davidson  Procedure(s) Performed: CESAREAN DELIVERY (Abdomen)     Patient location during evaluation: PACU Anesthesia Type: Spinal Level of consciousness: awake Pain management: pain level controlled Vital Signs Assessment: post-procedure vital signs reviewed and stable Respiratory status: spontaneous breathing, respiratory function stable and nonlabored ventilation Cardiovascular status: blood pressure returned to baseline and stable Postop Assessment: no headache, no backache and no apparent nausea or vomiting Anesthetic complications: no   No notable events documented.  Last Vitals:  Vitals:   06/01/24 1115 06/01/24 1126  BP: (!) 131/91 136/86  Pulse: 98 97  Resp: 18   Temp: 37.2 C 37.1 C  SpO2: 97% 98%    Last Pain:  Vitals:   06/01/24 1126  TempSrc: Oral                 Delon Aisha Arch

## 2024-06-02 ENCOUNTER — Encounter (HOSPITAL_COMMUNITY): Payer: Self-pay | Admitting: Obstetrics and Gynecology

## 2024-06-02 LAB — CBC
HCT: 26.9 % — ABNORMAL LOW (ref 36.0–46.0)
Hemoglobin: 8.6 g/dL — ABNORMAL LOW (ref 12.0–15.0)
MCH: 26.3 pg (ref 26.0–34.0)
MCHC: 32 g/dL (ref 30.0–36.0)
MCV: 82.3 fL (ref 80.0–100.0)
Platelets: 162 K/uL (ref 150–400)
RBC: 3.27 MIL/uL — ABNORMAL LOW (ref 3.87–5.11)
RDW: 14.5 % (ref 11.5–15.5)
WBC: 15.2 K/uL — ABNORMAL HIGH (ref 4.0–10.5)
nRBC: 0 % (ref 0.0–0.2)

## 2024-06-02 LAB — BIRTH TISSUE RECOVERY COLLECTION (PLACENTA DONATION)

## 2024-06-02 MED ORDER — FERROUS SULFATE 325 (65 FE) MG PO TABS
325.0000 mg | ORAL_TABLET | Freq: Every day | ORAL | Status: DC
  Administered 2024-06-02 – 2024-06-03 (×2): 325 mg via ORAL
  Filled 2024-06-02 (×3): qty 1

## 2024-06-02 NOTE — Progress Notes (Signed)
 Subjective: Postpartum Day 1: Cesarean Delivery Tolerating PO, voiding without difficulty. Pain controlled. +flatus, no BM  Objective: Vital signs in last 24 hours: Temp:  [97.6 F (36.4 C)-99 F (37.2 C)] 98.3 F (36.8 C) (10/18 0023) Pulse Rate:  [93-114] 101 (10/18 0524) Resp:  [14-23] 18 (10/18 0524) BP: (126-157)/(79-96) 126/79 (10/18 0524) SpO2:  [95 %-99 %] 98 % (10/18 0524)  Physical Exam:  General: alert, cooperative, and morbidly obese Lochia: appropriate Uterine Fundus: firm Incision: Honeycomb in place, no strikethrough DVT Evaluation: 2+ edema bilaterally, no cords or calf tenderness  Recent Labs    06/01/24 1142 06/02/24 0522  HGB 10.1* 8.6*  HCT 31.4* 26.9*    Assessment/Plan: 28 yo G2 now P1011 POD#1 s/p PLTCS for LGA - PP: routine care - ABLA, clinically significant for this hospitalization: EBL 988. Hb 8.6 this AM, start PO iron - DVT ppx: lovenox - GHTN: criteria met PP: BP improved since starting Procardia 30 XL - Desires circumcision.   Discussed r/b/a of the procedure.  Reviewed that circumcision is an elective surgical procedure and not considered medically necessary.  Reviewed the risks of the procedure including the risk of infection, bleeding, damage to surrounding structures, including scrotum, shaft, urethra and head of penis, and an undesired cosmetic effect requiring additional procedures for revision. - Dispo: anticipate DC home tomorrow  Larraine DELENA Sharps, DO 06/02/2024, 10:13 AM

## 2024-06-03 MED ORDER — FERROUS SULFATE 325 (65 FE) MG PO TABS: 325.0000 mg | ORAL_TABLET | ORAL | Status: AC

## 2024-06-03 MED ORDER — IBUPROFEN 800 MG PO TABS: 800.0000 mg | ORAL_TABLET | Freq: Three times a day (TID) | ORAL | 0 refills | Status: AC

## 2024-06-03 MED ORDER — NIFEDIPINE ER 30 MG PO TB24: 30.0000 mg | ORAL_TABLET | Freq: Every day | ORAL | 0 refills | Status: AC

## 2024-06-03 MED ORDER — ACETAMINOPHEN 500 MG PO TABS: 1000.0000 mg | ORAL_TABLET | Freq: Three times a day (TID) | ORAL | 0 refills | Status: AC

## 2024-06-03 NOTE — Discharge Instructions (Signed)
 Call office with any concerns [phone]

## 2024-06-03 NOTE — Lactation Note (Signed)
 This note was copied from a baby's chart. Lactation Consultation Note  Patient Name: Sarah Davidson Unijb'd Date: 06/03/2024 Age:30 hours Reason for consult: Follow-up assessment;Primapara  P1. Mom stated BF wasn't going that good right now. Mom stated she can't latch well on the Lt. Breast so he's mainly  been BF on the Rt. Breast and now both are sore. LC asked mom about the baby's urine output. LC only see's 1 void today and 3 stools. Mom stated the dad is changing the diapers but she doesn't know of any pee's. Suggested mom leave diapers that she changes for the RN to check to see if baby voids and it doesn't hit the blue line. Look for yellow spots/areas. Suggested hand expression and collect colostrum to spoon feed baby. LC collected 2 ml from Rt. Breast while baby is BF on the Lt. Breast. Baby is BF very well. It was painful at first then [facility] adjusted flange mom stated better. It is still painful because mom is sore but not like it was.  Body alignment, props, positioning, cheeks to breast reviewed. Asked mom if it was to painful mom stated no it's ok. Mom started cramping during feeding. Explained normal. You could hear baby swallowing a lot. Praised mom. Encouraged to call for assistance as needed.  Maternal Data Has patient been taught Hand Expression?: Yes Does the patient have breastfeeding experience prior to this delivery?: No  Feeding    LATCH Score Latch: Grasps breast easily, tongue down, lips flanged, rhythmical sucking.  Audible Swallowing: Spontaneous and intermittent  Type of Nipple: Flat  Comfort (Breast/Nipple): Filling, red/small blisters or bruises, mild/mod discomfort (sore)  Hold (Positioning): Assistance needed to correctly position infant at breast and maintain latch.  LATCH Score: 7   Lactation Tools Discussed/Used    Interventions Interventions: Breast feeding basics reviewed;Assisted with latch;Skin to skin;Breast massage;Hand express;Breast  compression;Adjust position;Support pillows;Position options;Education  Discharge Discharge Education: Outpatient recommendation Pump: DEBP (spectra )  Consult Status Consult Status: Follow-up Date: 06/03/24 Follow-up type: In-patient    Tiena Manansala G 06/03/2024, 4:35 AM

## 2024-06-03 NOTE — Discharge Summary (Addendum)
 Postpartum Discharge Summary  Date of Service updated     Patient Name: Sarah Davidson DOB: 1995/10/20 MRN: [id number]  Date of admission: 06/01/2024 Delivery date:06/01/2024 Delivering provider: SUDIE LAVONIA CHRISTELLA Date of discharge: 06/03/2024  Admitting diagnosis: Excessive fetal growth, antepartum, fetus 1 of multiple gestation [O36.60X1] Pregnancy [Z34.90] Intrauterine pregnancy: [redacted]w[redacted]d     Secondary diagnosis:  Principal Problem:   Pregnancy Active Problems:   Large for gestational age fetus affecting management of mother  Additional problems: Gestational hypertension, acute blood loss anemia, maternal obesity    Discharge diagnosis: Term Pregnancy Delivered, Gestational Hypertension, and Anemia                                              Post partum procedures:None Augmentation: N/A Complications: None  Hospital course: Sceduled C/S   28 y.o. yo G2P1011 at [redacted]w[redacted]d was admitted to the hospital 06/01/2024 for scheduled cesarean section with the following indication:concern for macrosomic infant, elective primary.Delivery details are as follows:  Membrane Rupture Time/Date: 9:34 AM,06/01/2024  Delivery Method:C-Section, Low Transverse Operative Delivery:N/A Details of operation can be found in separate operative note.  Patient had a postpartum course complicated by diagnosis of gestational hypertension and acute blood loss anemia.  She is ambulating, tolerating a regular diet, passing flatus, and urinating well. Patient is discharged home in stable condition on  06/03/24. She does not desire narcotics for discharge.        Newborn Data: Birth date:06/01/2024 Birth time:9:35 AM Gender:Female Living status:Living Apgars:8 ,9  Weight:4600 g    Magnesium Sulfate received: No BMZ received: No Rhophylac:N/A MMR:N/A T-DaP:Given prenatally Flu: Given prenatally RSV Vaccine received: No Transfusion:No Immunizations administered: Immunization History  Administered Date(s)  Administered   HPV 9-valent 07/07/2019, 08/03/2022   Hepatitis A 06/04/2019   Hpv-Unspecified 06/04/2019   Influenza,inj,Quad PF,6+ Mos 06/19/2020   Influenza-Unspecified 05/18/2013, 09/24/2016, 07/16/2021   Moderna Sars-Covid-2 Vaccination 08/16/2019, 09/13/2019, 07/11/2020   PPD Test 10/15/2016   Pneumococcal Polysaccharide-23 06/04/2019   Td 10/15/2016   Tdap 12/18/2019    Physical exam  Vitals:   06/02/24 0524 06/02/24 1715 06/02/24 2054 06/03/24 0501  BP: 126/79 133/70 135/81 134/78  Pulse: (!) 101 (!) 102 98 74  Resp: 18 18 18 18   Temp:  98.6 F (37 C) 98.4 F (36.9 C) 98.4 F (36.9 C)  TempSrc:  Oral Oral Axillary  SpO2: 98% 95% 99% 99%  Weight:      Height:       General: alert, cooperative, and no distress Lochia: appropriate Uterine Fundus: firm Incision: Dressing is clean, dry, and intact DVT Evaluation: No cords or calf tenderness. Calf/Ankle edema is present Labs: Lab Results  Component Value Date   WBC 15.2 (H) 06/02/2024   HGB 8.6 (L) 06/02/2024   HCT 26.9 (L) 06/02/2024   MCV 82.3 06/02/2024   PLT 162 06/02/2024      Latest Ref Rng & Units 06/01/2024    2:51 PM  CMP  Glucose 70 - 99 mg/dL 890   BUN 6 - 20 mg/dL 9   Creatinine 9.55 - 8.99 mg/dL 9.31   Sodium 864 - 854 mmol/L 136   Potassium 3.5 - 5.1 mmol/L 4.3   Chloride 98 - 111 mmol/L 106   CO2 22 - 32 mmol/L 21   Calcium 8.9 - 10.3 mg/dL 8.8   Total Protein 6.5 - 8.1  g/dL 5.6   Total Bilirubin 0.0 - 1.2 mg/dL 0.5   Alkaline Phos 38 - 126 U/L 94   AST 15 - 41 U/L 28   ALT 0 - 44 U/L 17    Edinburgh Score:    06/02/2024    9:01 AM  Edinburgh Postnatal Depression Scale Screening Tool  I have been able to laugh and see the funny side of things. 0  I have looked forward with enjoyment to things. 0  I have blamed myself unnecessarily when things went wrong. 0  I have been anxious or worried for no good reason. 0  I have felt scared or panicky for no good reason. 0  Things have been  getting on top of me. 0  I have been so unhappy that I have had difficulty sleeping. 0  I have felt sad or miserable. 0  I have been so unhappy that I have been crying. 0  The thought of harming myself has occurred to me. 0  Edinburgh Postnatal Depression Scale Total 0      After visit meds:  Allergies as of 06/03/2024       Reactions   Sulfa Antibiotics Hives        Medication List     TAKE these medications    acetaminophen  500 MG tablet Commonly known as: TYLENOL  Take 2 tablets (1,000 mg total) by mouth with breakfast, with lunch, and with evening meal for 10 days.   albuterol  108 (90 Base) MCG/ACT inhaler Commonly known as: ProAir  HFA TAKE 2 PUFFS BY MOUTH EVERY 6 HOURS AS NEEDED FOR WHEEZE OR SHORTNESS OF BREATH   ferrous sulfate 325 (65 FE) MG tablet Take 1 tablet (325 mg total) by mouth every other day.   ibuprofen 800 MG tablet Commonly known as: ADVIL Take 1 tablet (800 mg total) by mouth with breakfast, with lunch, and with evening meal.   NIFEdipine 30 MG 24 hr tablet Commonly known as: ADALAT CC Take 1 tablet (30 mg total) by mouth daily.   prenatal multivitamin Tabs tablet Take 1 tablet by mouth daily.               Discharge Care Instructions  (From admission, onward)           Start     Ordered   06/03/24 0000  Discharge wound care:       Comments: Remove Honeycomb bandage on 10/22 in shower. Pat incision dry after taking showers and place clean menstrual pad (soft side) against incision to prevent moisture from accumulating   06/03/24 0935             Discharge home in stable condition Infant Feeding: Breast Infant Disposition:home with mother Discharge instruction: per After Visit Summary and Postpartum booklet. Activity: Advance as tolerated. Pelvic rest for 6 weeks.  Diet: routine diet Anticipated Birth Control: Unsure Postpartum Appointment:6 weeks Additional Postpartum F/U: Postpartum Depression checkup, Incision  check 1 week, and BP check 1 week Future Appointments:No future appointments. Follow up Visit:  Follow-up Information     [facility] Go in 1 week(s).   Why: For wound re-check and BP recheck. Office will call to schedule Contact information: [address]  [address] [place] [address] [phone]                     06/03/2024 Larraine DELENA Sharps, DO

## 2024-06-03 NOTE — Lactation Note (Addendum)
 This note was copied from a baby's chart. Lactation Consultation Note  Patient Name: Sarah Davidson Unijb'd Date: 06/03/2024 Age:30 hours Reason for consult: Follow-up assessment;1st time breastfeeding  P1, Baby appears hungry.  Mother hand expressed ample flow of transitional breastmilk. Assisted with latching in football hold for 16 min on the R breast. Reviewed hand expression and mother prepumped on the L breast with visible transitional breastmilk. Baby latched and sustained latch for an additional 5 min.  Returned to room with a request to assist with using their Spectra  pump.  Demonstrated use and noted good flow of breastmilk. Mother pumped 18 ml.   Family has only been feeding baby on one breast per feeding.  Suggest always offering the second breast and pump 3-4 times per day and give volume back to baby.  Reviewed engorgement care and monitoring voids/stools.  Maternal Data Has patient been taught Hand Expression?: Yes Does the patient have breastfeeding experience prior to this delivery?: No  Feeding Mother's Current Feeding Choice: Breast Milk  LATCH Score Latch: Grasps breast easily, tongue down, lips flanged, rhythmical sucking.  Audible Swallowing: A few with stimulation  Type of Nipple: Everted at rest and after stimulation  Comfort (Breast/Nipple): Filling, red/small blisters or bruises, mild/mod discomfort  Hold (Positioning): Assistance needed to correctly position infant at breast and maintain latch.  LATCH Score: 7   Lactation Tools Discussed/Used    Interventions Interventions: Breast feeding basics reviewed;Assisted with latch;Skin to skin;Hand express;Pre-pump if needed;Education  Discharge Pump: DEBP  Consult Status Consult Status: Complete Date: 06/03/24 Follow-up type: In-patient    Sarah Davidson [facility] 06/03/2024, 1:23 PM

## 2024-06-03 NOTE — Progress Notes (Signed)
 Subjective: Postpartum Day 2: Cesarean Delivery Pain controlled without narcotics. Tolerating PO. Voiding without difficulty. Multiple BMs   Objective: Vital signs in last 24 hours: Temp:  [98.4 F (36.9 C)-98.6 F (37 C)] 98.4 F (36.9 C) (10/19 0501) Pulse Rate:  [74-102] 74 (10/19 0501) Resp:  [18] 18 (10/19 0501) BP: (133-135)/(70-81) 134/78 (10/19 0501) SpO2:  [95 %-99 %] 99 % (10/19 0501)  Physical Exam:  General: alert, cooperative, and no distress Lochia: appropriate Uterine Fundus: firm Incision: Honeycomb in place, clean and dry DVT Evaluation: No cords or calf tenderness. Calf/Ankle edema is present.  Recent Labs    06/01/24 1142 06/02/24 0522  HGB 10.1* 8.6*  HCT 31.4* 26.9*    Assessment/Plan: 28 yo G2 now P1011 POD#2 s/p PLTCS for LGA - PP: routine care - ABLA, clinically significant for this hospitalization: EBL 988. Hb 8.6. Continue iron, will DC with - DVT ppx: lovenox - GHTN: criteria met PP: well controlled on Procardia 30 XL - Dispo: DC home  Sarah DELENA Sharps, DO 06/03/2024, 9:28 AM

## 2024-06-03 NOTE — Progress Notes (Signed)
 CSW received consult for hx of anxiety and ADHD. CSW met with MOB to offer support and complete assessment. When CSW entered room, MOB was observed laying in hospital bed nursing infant. FOB and MOB's mother were present nearby. CSW introduced self and requested to speak with MOB alone. MOB provided verbal consent for CSW to complete consult while FOB and her mother remained in the room. CSW explained reason for consult. MOB presented as calm, was agreeable to consult and remained engaged throughout encounter.   CSW inquired how MOB is feeling emotionally since infant's arrival. MOB reports she is feeling good. CSW inquired about MOB's mental health history. MOB acknowledged a history of ADHD and anxiety, which she reports being diagnosed with when she started college in 2013. CSW inquired about mental health treatment. MOB states she is not prescribed psychotropic medication or in therapy. CSW inquired about MOB's mental health during pregnancy. MOB shared that she generally runs anxious but it does not interfere with her ability to care for herself or others. MOB reports feeling anxious during pregnancy but reports her symptoms felt manageable. MOB denied endorsing depressive symptoms. MOB reports having good support through FOB and her mother. MOB denied current SI/HI.   CSW provided education regarding the baby blues period vs. perinatal mood disorders, discussed treatment and gave resources for mental health follow up if concerns arise.  CSW recommends self-evaluation during the postpartum time period using the New Mom Checklist from [employer]partum [employer] and encouraged MOB to contact a medical professional if symptoms are noted at any time.    MOB reports she has all needed items for infant, including a car seat and bassinet. MOB has chosen [facility] for infant's follow up care.  CSW provided review of Sudden Infant Death Syndrome (SIDS) precautions.    CSW identifies no further need  for intervention and no barriers to discharge at this time.  Signed,  Sharyne LOIS Roulette, MSW, LCSWA, LCASA 06/03/2024 9:39 AM

## 2024-06-15 ENCOUNTER — Telehealth (HOSPITAL_COMMUNITY): Payer: Self-pay | Admitting: *Deleted

## 2024-06-15 NOTE — Telephone Encounter (Signed)
 06/15/2024  Name: [name] MRN: [id number] DOB: 06/09/1996  Reason for Call:  Transition of Care Hospital Discharge Call  Contact Status: Patient Contact Status: Complete  Language assistant needed: Interpreter Mode: Interpreter Not Needed        Follow-Up Questions: Do You Have Any Concerns About Your Health As You Heal From Delivery?: No Do You Have Any Concerns About Your Infants Health?: No  Edinburgh Postnatal Depression Scale:  In the Past 7 Days:    PHQ2-9 Depression Scale:     Discharge Follow-up: Edinburgh score requires follow up?:  (declines screening today, says she had a few days of Baby Blues and called her OB, she is now on medication and is doing well) Patient was advised of the following resources:: Support Group, Breastfeeding Support Group  Post-discharge interventions: Reviewed Newborn Safe Sleep Practices  Mliss Sieve, RN 06/15/2024 12:54

## 2024-06-20 DIAGNOSIS — Z98891 History of uterine scar from previous surgery: Secondary | ICD-10-CM | POA: Diagnosis not present

## 2024-06-20 DIAGNOSIS — Z4889 Encounter for other specified surgical aftercare: Secondary | ICD-10-CM | POA: Diagnosis not present

## 2024-07-19 DIAGNOSIS — Z3009 Encounter for other general counseling and advice on contraception: Secondary | ICD-10-CM | POA: Diagnosis not present

## 2024-07-19 DIAGNOSIS — Z1331 Encounter for screening for depression: Secondary | ICD-10-CM | POA: Diagnosis not present

## 2024-07-19 DIAGNOSIS — Z1389 Encounter for screening for other disorder: Secondary | ICD-10-CM | POA: Diagnosis not present
